# Patient Record
Sex: Female | Born: 1980 | Race: White | Hispanic: Yes | Smoking: Never smoker
Health system: Southern US, Community
[De-identification: ages and names within clinical notes are randomized; demographics above are authoritative.]

## PROBLEM LIST (undated history)

## (undated) ENCOUNTER — Inpatient Hospital Stay (HOSPITAL_COMMUNITY): Payer: Self-pay

## (undated) ENCOUNTER — Emergency Department (HOSPITAL_COMMUNITY): Admission: EM

## (undated) DIAGNOSIS — E119 Type 2 diabetes mellitus without complications: Secondary | ICD-10-CM

## (undated) DIAGNOSIS — N3021 Other chronic cystitis with hematuria: Secondary | ICD-10-CM

## (undated) DIAGNOSIS — N029 Recurrent and persistent hematuria with unspecified morphologic changes: Secondary | ICD-10-CM

## (undated) DIAGNOSIS — R011 Cardiac murmur, unspecified: Secondary | ICD-10-CM

## (undated) DIAGNOSIS — F419 Anxiety disorder, unspecified: Secondary | ICD-10-CM

## (undated) DIAGNOSIS — O24419 Gestational diabetes mellitus in pregnancy, unspecified control: Secondary | ICD-10-CM

## (undated) DIAGNOSIS — O139 Gestational [pregnancy-induced] hypertension without significant proteinuria, unspecified trimester: Secondary | ICD-10-CM

## (undated) HISTORY — DX: Gestational diabetes mellitus in pregnancy, unspecified control: O24.419

## (undated) HISTORY — PX: HAND SURGERY: SHX662

## (undated) HISTORY — PX: ABDOMINOPLASTY: SUR9

## (undated) HISTORY — DX: Anxiety disorder, unspecified: F41.9

---
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
## 2005-11-21 ENCOUNTER — Encounter: Admission: RE | Admit: 2005-11-21 | Discharge: 2005-11-21 | Payer: Self-pay | Admitting: Occupational Medicine

## 2005-12-23 ENCOUNTER — Encounter: Admission: RE | Admit: 2005-12-23 | Discharge: 2006-02-04 | Payer: Self-pay | Admitting: Occupational Medicine

## 2007-09-07 ENCOUNTER — Encounter: Admission: RE | Admit: 2007-09-07 | Discharge: 2007-10-27 | Payer: Self-pay | Admitting: Obstetrics & Gynecology

## 2007-09-23 ENCOUNTER — Inpatient Hospital Stay (HOSPITAL_COMMUNITY): Admission: AD | Admit: 2007-09-23 | Discharge: 2007-09-23 | Payer: Self-pay | Admitting: Obstetrics and Gynecology

## 2007-10-13 ENCOUNTER — Inpatient Hospital Stay (HOSPITAL_COMMUNITY): Admission: AD | Admit: 2007-10-13 | Discharge: 2007-10-13 | Payer: Self-pay | Admitting: Obstetrics and Gynecology

## 2007-11-11 ENCOUNTER — Inpatient Hospital Stay (HOSPITAL_COMMUNITY): Admission: RE | Admit: 2007-11-11 | Discharge: 2007-11-14 | Payer: Self-pay | Admitting: Obstetrics and Gynecology

## 2007-11-12 ENCOUNTER — Encounter (INDEPENDENT_AMBULATORY_CARE_PROVIDER_SITE_OTHER): Payer: Self-pay | Admitting: Obstetrics and Gynecology

## 2010-09-10 NOTE — Op Note (Signed)
[name], Summer Padilla             ACCOUNT NO.:  [id number]   MEDICAL RECORD NO.:  [id number]          PATIENT TYPE:  INP   LOCATION:  9147                          FACILITY:  [facility]   PHYSICIAN:  Carrington Clamp, M.D. DATE OF BIRTH:  Jan 26, 1981   DATE OF PROCEDURE:  11/12/2007  DATE OF DISCHARGE:                               OPERATIVE REPORT   PREOPERATIVE DIAGNOSES:  1. Arrest of active phase.  2. Nonreassuring fetal heart tones, remote from vaginal delivery.   POSTOPERATIVE DIAGNOSES:  1. Arrest of active phase.  2. Nonreassuring fetal heart tones, remote from vaginal delivery.   PROCEDURE:  Low-transverse cesarean section.   SURGEON:  Carrington Clamp, MD   ASSISTANTS:  None.   ANESTHESIA:  Epidural.   FINDINGS:  A female infant, Apgars 8 and 9, weight 8 pounds even, normal  tubes, ovaries and uterus seen.   SPECIMEN:  Placenta to pathology.   BLOOD LOSS:  600 mL.   IV FLUIDS:  2000 mL.   URINE OUTPUT:  200 mL.   COMPLICATIONS:  None.   MEDICATIONS:  Ancef and Pitocin.   COUNTS:  Correct x3.   TECHNIQUE:  After adequate epidural anesthesia was achieved, the patient  was prepped and draped in the usual sterile fashion in dorsal supine  position with leftward tilt.  A Pfannenstiel skin incision was made with  a scalpel and carried down to the fascia with Bovie cautery.  The fascia  was incised in the midline with the scalpel and carried in a transverse  curvilinear manner with the Mayo scissors.  The fascia was reflected  superior and inferior from the rectus muscles.  The rectus muscles were  split in the midline.  A bowel-free portion of peritoneum was entered  into carefully with blunt dissection, and then opened in a superior-  inferior manner with good visualization of bowel and the bladder.   The [employer] was then placed to operating field.  The  vesicouterine fascia was then tented up and incised with a pair of  Metzenbaum scissors, and the  bladder flap was created with the sharp and  blunt dissection.  A 2-cm incision was made in the upper portion of the  lower uterine segment until the clear fluid could be seen and on entry  into amnion.  The uterine incision was extended manually, and the baby  was identified in the OP presentation and delivered without  complication.  There was a nuchal cord x1, which was reduced, and the  baby was bulb suctioned, and cord was clamped and cut, and the baby was  handed to awaiting pediatrics.   The placenta was then delivered manually, and the uterus was cleared of  all debris with a wet lap.  The uterine incision was closed with a  running lock stitch of 0 Monocryl.  An imbricating layer of 0 Monocryl  was performed as well.  Once hemostasis was achieved with an additional  figure-of-eight stitch, the abdomen was irrigated, and the [employer] removed from the abdomen.  The peritoneum was then closed  with a running stitch of 2-0 Vicryl,  which incorporated the rectus  muscles as a separate layer.  The fascia was closed with running stitch  of 0 Vicryl.  The subcutaneous tissue was rendered hemostatic with Bovie  cautery and irrigation.  The subcutaneous tissue was closed with  interrupted stitches of 2-0 plain gut.  Staples were used to close the  skin.  The patient was returned to recovery room in stable condition.      Carrington Clamp, M.D.  Electronically Signed     MH/MEDQ  D:  11/12/2007  T:  11/12/2007  Job:  [id number]

## 2010-09-13 NOTE — Discharge Summary (Signed)
Summer Padilla, Summer Padilla             ACCOUNT NO.:  [id number]   MEDICAL RECORD NO.:  [id number]          PATIENT TYPE:  INP   LOCATION:  9147                          FACILITY:  [facility]   PHYSICIAN:  Malva Limes, M.D.    DATE OF BIRTH:  Jul 13, 1980   DATE OF ADMISSION:  11/11/2007  DATE OF DISCHARGE:  11/14/2007                               DISCHARGE SUMMARY   FINAL DIAGNOSES:  1. Intrauterine pregnancy at 88 weeks' gestation.  2. Induction of labor.  3. Gestational diabetes mellitus.  4. Arrest of the active phase of labor.  5. Nonreassuring fetal heart tones, remote from vaginal delivery.   PROCEDURE:  Primary low-transverse cesarean section.   SURGEON:  Carrington Clamp, MD   COMPLICATIONS:  None.   This 30 year old G3, P 1-0-1-1 presents at 60 weeks' gestation for an  induction at term.  The patient did have gestational diabetes and had  been on glyburide for her blood sugars.  The patient had also had  antepartum monitoring with nonstress tests and BPPs, which were normal.  Otherwise, the patient's antepartum course was uncomplicated.  She did  have a negative group B strep culture obtained at term.  The patient was  admitted for induction of labor.  She had a protracted labor curve  throughout the day.  There was continuation of some moderate variables  noted on the monitor because of this arrest of the active phase of labor  and these nonreassuring fetal heart tones, a decision was made to  proceed with a cesarean section.  The patient was taken to the operating  room on November 12, 2007, by Dr. Carrington Clamp, where a primary low  transverse cesarean section was performed with the delivery of an 8-  pound female infant with Apgars of 8 and 9.  The delivery went without  complications.  The patient's postoperative course was benign without  significant fevers.  The patient was felt ready for discharge.  On  postoperative day #2, she was sent home on a regular diet, told to  decrease activities, told to continue her prenatal vitamins and was  given Percocet 1-2 every 4-6 hours as needed for pain, and was to  continue her iron supplement daily, was to follow up in our office in 2  weeks for an incision check with Dr. Henderson Cloud.   LABORATORY ON DISCHARGE:  The patient had a hemoglobin of 8.5, white  blood cell count of 10.4, and platelets of 196,000.      Leilani Able, P.A.-C.      Malva Limes, M.D.     MB/MEDQ  D:  12/21/2007  T:  12/22/2007  Job:  [id number]

## 2011-01-22 LAB — URINALYSIS, ROUTINE W REFLEX MICROSCOPIC
Bilirubin Urine: NEGATIVE
Glucose, UA: NEGATIVE
Ketones, ur: NEGATIVE
Leukocytes, UA: NEGATIVE
Nitrite: NEGATIVE
Protein, ur: NEGATIVE
Specific Gravity, Urine: 1.025
Urobilinogen, UA: 0.2
pH: 6.5

## 2011-01-22 LAB — URINE MICROSCOPIC-ADD ON

## 2011-01-24 LAB — CBC
HCT: 26.3 — ABNORMAL LOW
HCT: 28.9 — ABNORMAL LOW
HCT: 35.9 — ABNORMAL LOW
Hemoglobin: 11.8 — ABNORMAL LOW
Hemoglobin: 8.5 — ABNORMAL LOW
Hemoglobin: 9.5 — ABNORMAL LOW
MCHC: 32.4
MCHC: 32.7
MCHC: 32.9
MCV: 82.6
MCV: 83.2
MCV: 83.9
Platelets: 192
Platelets: 196
Platelets: 222
RBC: 3.13 — ABNORMAL LOW
RBC: 3.48 — ABNORMAL LOW
RBC: 4.35
RDW: 14.6
RDW: 15.1
RDW: 15.2
WBC: 10.4
WBC: 13.2 — ABNORMAL HIGH
WBC: 8.1

## 2011-01-24 LAB — GLUCOSE, CAPILLARY
Glucose-Capillary: 110 — ABNORMAL HIGH
Glucose-Capillary: 132 — ABNORMAL HIGH
Glucose-Capillary: 61 — ABNORMAL LOW
Glucose-Capillary: 72
Glucose-Capillary: 77
Glucose-Capillary: 77
Glucose-Capillary: 79

## 2011-01-24 LAB — RPR: RPR Ser Ql: NONREACTIVE

## 2012-01-29 LAB — OB RESULTS CONSOLE RPR: RPR: NONREACTIVE

## 2012-01-29 LAB — CYSTIC FIBROSIS DIAGNOSTIC STUDY: Interpretation-CFDNA:: NEGATIVE

## 2012-01-29 LAB — OB RESULTS CONSOLE ABO/RH: RH Type: POSITIVE

## 2012-01-29 LAB — OB RESULTS CONSOLE HIV ANTIBODY (ROUTINE TESTING): HIV: NONREACTIVE

## 2012-01-29 LAB — OB RESULTS CONSOLE RUBELLA ANTIBODY, IGM: Rubella: IMMUNE

## 2012-01-29 LAB — OB RESULTS CONSOLE VARICELLA ZOSTER ANTIBODY, IGG: Varicella: IMMUNE

## 2012-01-29 LAB — OB RESULTS CONSOLE HEPATITIS B SURFACE ANTIGEN: Hepatitis B Surface Ag: NEGATIVE

## 2012-01-29 LAB — OB RESULTS CONSOLE ANTIBODY SCREEN: Antibody Screen: NEGATIVE

## 2012-01-29 LAB — SICKLE CELL SCREEN: Sickle Cell Screen: NEGATIVE

## 2012-02-28 ENCOUNTER — Inpatient Hospital Stay (HOSPITAL_COMMUNITY): Payer: Medicaid Other

## 2012-02-28 ENCOUNTER — Inpatient Hospital Stay (HOSPITAL_COMMUNITY)
Admission: AD | Admit: 2012-02-28 | Discharge: 2012-02-28 | Disposition: A | Discharge status: Home / Self Care | Payer: Medicaid Other | Source: Ambulatory Visit | Attending: Family Medicine | Admitting: Family Medicine

## 2012-02-28 ENCOUNTER — Encounter (HOSPITAL_COMMUNITY): Payer: Self-pay | Admitting: *Deleted

## 2012-02-28 DIAGNOSIS — O209 Hemorrhage in early pregnancy, unspecified: Secondary | ICD-10-CM | POA: Insufficient documentation

## 2012-02-28 DIAGNOSIS — IMO0002 Reserved for concepts with insufficient information to code with codable children: Secondary | ICD-10-CM

## 2012-02-28 DIAGNOSIS — O468X9 Other antepartum hemorrhage, unspecified trimester: Secondary | ICD-10-CM

## 2012-02-28 DIAGNOSIS — O418X90 Other specified disorders of amniotic fluid and membranes, unspecified trimester, not applicable or unspecified: Secondary | ICD-10-CM

## 2012-02-28 HISTORY — DX: Cardiac murmur, unspecified: R01.1

## 2012-02-28 LAB — URINALYSIS, ROUTINE W REFLEX MICROSCOPIC
Bilirubin Urine: NEGATIVE
Glucose, UA: NEGATIVE mg/dL
Ketones, ur: 15 mg/dL — AB
Nitrite: NEGATIVE
Protein, ur: 100 mg/dL — AB
Specific Gravity, Urine: 1.02 (ref 1.005–1.030)
Urobilinogen, UA: 1 mg/dL (ref 0.0–1.0)
pH: 7.5 (ref 5.0–8.0)

## 2012-02-28 LAB — CBC
HCT: 37.2 % (ref 36.0–46.0)
Hemoglobin: 12.3 g/dL (ref 12.0–15.0)
MCH: 26.7 pg (ref 26.0–34.0)
MCHC: 33.1 g/dL (ref 30.0–36.0)
MCV: 80.9 fL (ref 78.0–100.0)
Platelets: 198 10*3/uL (ref 150–400)
RBC: 4.6 MIL/uL (ref 3.87–5.11)
RDW: 13.4 % (ref 11.5–15.5)
WBC: 7.3 10*3/uL (ref 4.0–10.5)

## 2012-02-28 LAB — WET PREP, GENITAL
Clue Cells Wet Prep HPF POC: NONE SEEN
Trich, Wet Prep: NONE SEEN
Yeast Wet Prep HPF POC: NONE SEEN

## 2012-02-28 LAB — URINE MICROSCOPIC-ADD ON

## 2012-02-28 LAB — ABO/RH: ABO/RH(D): O POS

## 2012-02-28 NOTE — MAU Provider Note (Signed)
Chart reviewed and agree with management and plan.  

## 2012-02-28 NOTE — MAU Note (Signed)
Pt presents to MAU with chief complaint of vaginal bleeding. Pt says the bleeding started 45 mins ago; she noticed several small blood clots. Pt is [redacted]w[redacted]d. Pt denies any new onset of pain, says she has back pain but that started a while back

## 2012-02-28 NOTE — MAU Provider Note (Signed)
History     CSN: [id number]  Arrival date and time: 02/28/12 4098   First Provider Initiated Contact with Patient 02/28/12 1633      Chief Complaint  Patient presents with  . Vaginal Bleeding   HPI 31 y.o. [id number] at [redacted]w[redacted]d with vaginal bleeding x about 1 hour, low back pain ongoing for a few weeks. No prior bleeding episodes. PNC at [facility], normal ultrasound there. No pelvic exam yet.    Past Medical History  Diagnosis Date  . Murmur, cardiac     Past Surgical History  Procedure Date  . Cesarean section   . Hand surgery     History reviewed. No pertinent family history.  History  Substance Use Topics  . Smoking status: Never Smoker   . Smokeless tobacco: Not on file  . Alcohol Use: No    Allergies: No Known Allergies  Prescriptions prior to admission  Medication Sig Dispense Refill  . Prenatal Vit-Fe Fumarate-FA (PRENATAL MULTIVITAMIN) TABS Take 1 tablet by mouth daily.        Review of Systems  Constitutional: Negative.   Respiratory: Negative.   Cardiovascular: Negative.   Gastrointestinal: Negative for nausea, vomiting, abdominal pain, diarrhea and constipation.  Genitourinary: Negative for dysuria, urgency, frequency, hematuria and flank pain.       Positive for vaginal bleeding   Musculoskeletal: Negative.   Neurological: Negative.   Psychiatric/Behavioral: Negative.    Physical Exam   Blood pressure 109/46, pulse 90, temperature 99.1 F (37.3 C), resp. rate 18, height 5\' 2"  (1.575 m), weight 97.614 kg (215 lb 3.2 oz), last menstrual period 11/18/2011.  Physical Exam  Nursing note and vitals reviewed. Constitutional: She is oriented to person, place, and time. She appears well-developed and well-nourished. No distress.  Cardiovascular: Normal rate.   Respiratory: Effort normal.  GI: Soft. There is no tenderness.  Genitourinary: There is no tenderness or lesion on the right labia. There is no tenderness or lesion on the left labia. Uterus  is not tender. Cervix exhibits no motion tenderness and no discharge. Right adnexum displays no tenderness and no fullness. Left adnexum displays no tenderness and no fullness. There is bleeding (small clots in vagina, no active bleeding) around the vagina.       Cervix long and closed   Musculoskeletal: Normal range of motion.  Neurological: She is oriented to person, place, and time.  Skin: Skin is warm.  Psychiatric: She has a normal mood and affect.    MAU Course  Procedures  Results for orders placed during the hospital encounter of 02/28/12 (from the past 24 hour(s))  URINALYSIS, ROUTINE W REFLEX MICROSCOPIC     Status: Abnormal   Collection Time   02/28/12  4:05 PM      Component Value Range   Color, Urine RED (*) YELLOW   APPearance CLOUDY (*) CLEAR   Specific Gravity, Urine 1.020  1.005 - 1.030   pH 7.5  5.0 - 8.0   Glucose, UA NEGATIVE  NEGATIVE mg/dL   Hgb urine dipstick LARGE (*) NEGATIVE   Bilirubin Urine NEGATIVE  NEGATIVE   Ketones, ur 15 (*) NEGATIVE mg/dL   Protein, ur 295 (*) NEGATIVE mg/dL   Urobilinogen, UA 1.0  0.0 - 1.0 mg/dL   Nitrite NEGATIVE  NEGATIVE   Leukocytes, UA TRACE (*) NEGATIVE  URINE MICROSCOPIC-ADD ON     Status: Abnormal   Collection Time   02/28/12  4:05 PM      Component Value Range   Squamous  Epithelial / LPF FEW (*) RARE   WBC, UA 3-6  <3 WBC/hpf   RBC / HPF TOO NUMEROUS TO COUNT  <3 RBC/hpf   Bacteria, UA FEW (*) RARE  CBC     Status: Normal   Collection Time   02/28/12  4:20 PM      Component Value Range   WBC 7.3  4.0 - 10.5 K/uL   RBC 4.60  3.87 - 5.11 MIL/uL   Hemoglobin 12.3  12.0 - 15.0 g/dL   HCT 16.1  09.6 - 04.5 %   MCV 80.9  78.0 - 100.0 fL   MCH 26.7  26.0 - 34.0 pg   MCHC 33.1  30.0 - 36.0 g/dL   RDW 40.9  81.1 - 91.4 %   Platelets 198  150 - 400 K/uL  ABO/RH     Status: Normal   Collection Time   02/28/12  4:20 PM      Component Value Range   ABO/RH(D) O POS    WET PREP, GENITAL     Status: Abnormal    Collection Time   02/28/12  4:39 PM      Component Value Range   Yeast Wet Prep HPF POC NONE SEEN  NONE SEEN   Trich, Wet Prep NONE SEEN  NONE SEEN   Clue Cells Wet Prep HPF POC NONE SEEN  NONE SEEN   WBC, Wet Prep HPF POC FEW (*) NONE SEEN   US Ob Comp Less 14 Wks  02/28/2012  *RADIOLOGY REPORT*  Clinical Data: Vaginal bleeding  OBSTETRIC <14 WK ULTRASOUND, TRANSVAGINAL OB US  Technique:  Transabdominal and transvaginal ultrasound was performed for evaluation of the gestation as well as the maternal uterus and adnexal regions.  Findings:  There is a single intrauterine gestational sac which contains an embryo.  Fetal heart rate is 176 bpm.  Crown-rump length equals 4.81 cm.  This corresponds to a 11-week-5- day gestation.  EDC:  09/17/2012  Maternal uterus/adnexae:  There is a small subchorionic hemorrhage which measures 1.4 x 1.3 x 1.2 cm.  The right ovary is not visualized.  The left ovary is normal.  No free fluid.  IMPRESSION:  1.  Single living intrauterine gestation. 2.  Estimated gestational age is 11 weeks and 5 days.  This is concordant with the original assigned gestational age [redacted] weeks and 1 day. 3.  Small subchorionic hemorrhage.   Original Report Authenticated By: Signa Kell, M.D.     Assessment and Plan   1. Subchorionic hematoma   2. Bleeding in early pregnancy       Medication List     As of 02/28/2012  7:01 PM    CONTINUE taking these medications         prenatal multivitamin Tabs            Follow-up Information    Follow up with [facility]. (as scheduled or sooner as needed)    Contact information:   [address] [place] [address] [phone]           Jafari Mckillop 02/28/2012, 5:45 PM

## 2012-02-28 NOTE — MAU Note (Signed)
Pt reports she started having vaginal bleeding 1 hr ago "alot of clots". Having some cramping as well.

## 2012-02-29 LAB — URINE CULTURE: Colony Count: 10000

## 2012-03-01 LAB — GC/CHLAMYDIA PROBE AMP, GENITAL
Chlamydia, DNA Probe: NEGATIVE
GC Probe Amp, Genital: NEGATIVE

## 2012-04-09 ENCOUNTER — Emergency Department (HOSPITAL_BASED_OUTPATIENT_CLINIC_OR_DEPARTMENT_OTHER)
Admission: EM | Admit: 2012-04-09 | Discharge: 2012-04-09 | Disposition: A | Discharge status: Home / Self Care | Payer: Medicaid Other | Attending: Emergency Medicine | Admitting: Emergency Medicine

## 2012-04-09 ENCOUNTER — Encounter (HOSPITAL_BASED_OUTPATIENT_CLINIC_OR_DEPARTMENT_OTHER): Payer: Self-pay | Admitting: *Deleted

## 2012-04-09 DIAGNOSIS — R011 Cardiac murmur, unspecified: Secondary | ICD-10-CM | POA: Insufficient documentation

## 2012-04-09 DIAGNOSIS — O9989 Other specified diseases and conditions complicating pregnancy, childbirth and the puerperium: Secondary | ICD-10-CM | POA: Insufficient documentation

## 2012-04-09 DIAGNOSIS — R55 Syncope and collapse: Secondary | ICD-10-CM

## 2012-04-09 DIAGNOSIS — Z8742 Personal history of other diseases of the female genital tract: Secondary | ICD-10-CM | POA: Insufficient documentation

## 2012-04-09 DIAGNOSIS — Z3490 Encounter for supervision of normal pregnancy, unspecified, unspecified trimester: Secondary | ICD-10-CM

## 2012-04-09 HISTORY — DX: Gestational (pregnancy-induced) hypertension without significant proteinuria, unspecified trimester: O13.9

## 2012-04-09 HISTORY — DX: Recurrent and persistent hematuria with unspecified morphologic changes: N02.9

## 2012-04-09 LAB — CBC WITH DIFFERENTIAL/PLATELET
Basophils Absolute: 0 10*3/uL (ref 0.0–0.1)
Basophils Relative: 0 % (ref 0–1)
Eosinophils Absolute: 0.1 10*3/uL (ref 0.0–0.7)
Eosinophils Relative: 1 % (ref 0–5)
HCT: 33.6 % — ABNORMAL LOW (ref 36.0–46.0)
Hemoglobin: 11.3 g/dL — ABNORMAL LOW (ref 12.0–15.0)
Lymphocytes Relative: 21 % (ref 12–46)
Lymphs Abs: 1.5 10*3/uL (ref 0.7–4.0)
MCH: 26.8 pg (ref 26.0–34.0)
MCHC: 33.6 g/dL (ref 30.0–36.0)
MCV: 79.6 fL (ref 78.0–100.0)
Monocytes Absolute: 0.4 10*3/uL (ref 0.1–1.0)
Monocytes Relative: 6 % (ref 3–12)
Neutro Abs: 5.2 10*3/uL (ref 1.7–7.7)
Neutrophils Relative %: 73 % (ref 43–77)
Platelets: 187 10*3/uL (ref 150–400)
RBC: 4.22 MIL/uL (ref 3.87–5.11)
RDW: 13.6 % (ref 11.5–15.5)
WBC: 7.2 10*3/uL (ref 4.0–10.5)

## 2012-04-09 LAB — COMPREHENSIVE METABOLIC PANEL
ALT: 7 U/L (ref 0–35)
AST: 13 U/L (ref 0–37)
Albumin: 3 g/dL — ABNORMAL LOW (ref 3.5–5.2)
Alkaline Phosphatase: 52 U/L (ref 39–117)
BUN: 8 mg/dL (ref 6–23)
CO2: 22 mEq/L (ref 19–32)
Calcium: 9.8 mg/dL (ref 8.4–10.5)
Chloride: 102 mEq/L (ref 96–112)
Creatinine, Ser: 0.6 mg/dL (ref 0.50–1.10)
GFR calc Af Amer: >90 mL/min (ref >90)
GFR calc non Af Amer: >90 mL/min (ref >90)
Glucose, Bld: 126 mg/dL — ABNORMAL HIGH (ref 70–99)
Potassium: 4.1 mEq/L (ref 3.5–5.1)
Sodium: 135 mEq/L (ref 135–145)
Total Bilirubin: 0.4 mg/dL (ref 0.3–1.2)
Total Protein: 7.1 g/dL (ref 6.0–8.3)

## 2012-04-09 LAB — URINALYSIS, ROUTINE W REFLEX MICROSCOPIC
Bilirubin Urine: NEGATIVE
Bilirubin Urine: NEGATIVE
Glucose, UA: NEGATIVE mg/dL
Glucose, UA: NEGATIVE mg/dL
Ketones, ur: NEGATIVE mg/dL
Ketones, ur: NEGATIVE mg/dL
Leukocytes, UA: NEGATIVE
Nitrite: NEGATIVE
Nitrite: NEGATIVE
Protein, ur: 30 mg/dL — AB
Protein, ur: NEGATIVE mg/dL
Specific Gravity, Urine: 1.021 (ref 1.005–1.030)
Specific Gravity, Urine: 1.007 (ref 1.005–1.030)
Urobilinogen, UA: 1 mg/dL (ref 0.0–1.0)
Urobilinogen, UA: 0.2 mg/dL (ref 0.0–1.0)
pH: 7.5 (ref 5.0–8.0)
pH: 7.5 (ref 5.0–8.0)

## 2012-04-09 LAB — URINE MICROSCOPIC-ADD ON

## 2012-04-09 MED ORDER — METOCLOPRAMIDE HCL 5 MG/ML IJ SOLN
10.0000 mg | Freq: Once | INTRAMUSCULAR | Status: AC
  Administered 2012-04-09: 10 mg via INTRAVENOUS
  Filled 2012-04-09: qty 2

## 2012-04-09 MED ORDER — ACETAMINOPHEN 325 MG PO TABS
650.0000 mg | ORAL_TABLET | Freq: Once | ORAL | Status: AC
  Administered 2012-04-09: 650 mg via ORAL
  Filled 2012-04-09: qty 2

## 2012-04-09 MED ORDER — DIPHENHYDRAMINE HCL 50 MG/ML IJ SOLN
25.0000 mg | Freq: Once | INTRAMUSCULAR | Status: AC
  Administered 2012-04-09: 25 mg via INTRAVENOUS
  Filled 2012-04-09: qty 1

## 2012-04-09 MED ORDER — SODIUM CHLORIDE 0.9 % IV SOLN
Freq: Once | INTRAVENOUS | Status: AC
  Administered 2012-04-09: 16:00:00 via INTRAVENOUS

## 2012-04-09 MED ORDER — FENTANYL CITRATE 0.05 MG/ML IJ SOLN
INTRAMUSCULAR | Status: AC
  Filled 2012-04-09: qty 2

## 2012-04-09 MED ORDER — ONDANSETRON HCL 4 MG/2ML IJ SOLN
INTRAMUSCULAR | Status: AC
  Filled 2012-04-09: qty 2

## 2012-04-09 NOTE — ED Provider Notes (Signed)
Medical screening examination/treatment/procedure(s) were performed by non-physician practitioner and as supervising physician I was immediately available for consultation/collaboration.   Gwyneth Sprout, MD 04/09/12 [phone]

## 2012-04-09 NOTE — ED Notes (Signed)
Patient states she was standing at the dentist office with her daughter and had a sudden onset of dizziness, coldness in her face and felt like she was going to faint and fall.  Lasted about 6 minutes, and now c/o of a headache.  Dizziness has resolved.  States she has a history of headaches, and was seen by Neurologist and prescribed topamax, which she did not start because she became pregnant.  Pt is [redacted] weeks pregnant and is receiving regular prenatal care.  Denies any recent illness.

## 2012-04-09 NOTE — ED Notes (Signed)
No rx given. Pt's husband Elita Quick is driving her home

## 2012-04-09 NOTE — ED Provider Notes (Signed)
History     CSN: [id number]  Arrival date & time 04/09/12  1158   First MD Initiated Contact with Patient 04/09/12 1232      Chief Complaint  Patient presents with  . Dizziness    (Consider location/radiation/quality/duration/timing/severity/associated sxs/prior treatment) Patient is a 31 y.o. female presenting with headaches. The history is provided by the patient. No language interpreter was used.  Headache  This is a new problem. The current episode started less than 1 hour ago. The headache is associated with nothing. The pain is mild. The pain does not radiate. She has tried nothing for the symptoms. The treatment provided no relief.  Pt reports she was at the dentist office with her child.  Pt reports she felt like she was going to pass out.  (Pt was standing in room during a procedure)   Pt reports she currently has a headache  Past Medical History  Diagnosis Date  . Murmur, cardiac   . Gestational HTN     Sept 2012 post partum  . Benign hematuria     Past Surgical History  Procedure Date  . Cesarean section   . Hand surgery     No family history on file.  History  Substance Use Topics  . Smoking status: Never Smoker   . Smokeless tobacco: Not on file  . Alcohol Use: No    OB History    Grav Para Term Preterm Abortions TAB SAB Ect Mult Living   5 3 3  0 1 0 1 0 0 3      Review of Systems  Neurological: Positive for headaches.  All other systems reviewed and are negative.    Allergies  Review of patient's allergies indicates no known allergies.  Home Medications   Current Outpatient Rx  Name  Route  Sig  Dispense  Refill  . PRENATAL MULTIVITAMIN CH   Oral   Take 1 tablet by mouth daily.           BP 131/70  Pulse 86  Temp 98.4 F (36.9 C) (Oral)  Resp 20  Ht 5\' 2"  (1.575 m)  Wt 218 lb (98.884 kg)  BMI 39.87 kg/m2  SpO2 100%  LMP 11/18/2011  Physical Exam  Nursing note and vitals reviewed. Constitutional: She appears  well-developed and well-nourished.  HENT:  Head: Normocephalic.  Right Ear: External ear normal.  Left Ear: External ear normal.  Nose: Nose normal.  Mouth/Throat: Oropharynx is clear and moist.  Eyes: Conjunctivae normal and EOM are normal. Pupils are equal, round, and reactive to light.  Neck: Normal range of motion. Neck supple.  Cardiovascular: Normal rate.   Pulmonary/Chest: Effort normal.  Abdominal: Soft.  Musculoskeletal: Normal range of motion.  Neurological: She is alert.  Skin: Skin is warm.  Psychiatric: She has a normal mood and affect.    ED Course  Procedures (including critical care time)  Labs Reviewed  URINALYSIS, ROUTINE W REFLEX MICROSCOPIC - Abnormal; Notable for the following:    APPearance TURBID (*)     Hgb urine dipstick LARGE (*)     Protein, ur 30 (*)     Leukocytes, UA SMALL (*)     All other components within normal limits  CBC WITH DIFFERENTIAL - Abnormal; Notable for the following:    Hemoglobin 11.3 (*)     HCT 33.6 (*)     All other components within normal limits  COMPREHENSIVE METABOLIC PANEL - Abnormal; Notable for the following:    Glucose, Bld 126 (*)  Albumin 3.0 (*)     All other components within normal limits  URINE MICROSCOPIC-ADD ON - Abnormal; Notable for the following:    Squamous Epithelial / LPF MANY (*)     Bacteria, UA MANY (*)     All other components within normal limits  URINE CULTURE   No results found.   No diagnosis found.    MDM   Results for orders placed during the hospital encounter of 04/09/12  URINALYSIS, ROUTINE W REFLEX MICROSCOPIC      Component Value Range   Color, Urine YELLOW  YELLOW   APPearance TURBID (*) CLEAR   Specific Gravity, Urine 1.021  1.005 - 1.030   pH 7.5  5.0 - 8.0   Glucose, UA NEGATIVE  NEGATIVE mg/dL   Hgb urine dipstick LARGE (*) NEGATIVE   Bilirubin Urine NEGATIVE  NEGATIVE   Ketones, ur NEGATIVE  NEGATIVE mg/dL   Protein, ur 30 (*) NEGATIVE mg/dL   Urobilinogen, UA  1.0  0.0 - 1.0 mg/dL   Nitrite NEGATIVE  NEGATIVE   Leukocytes, UA SMALL (*) NEGATIVE  CBC WITH DIFFERENTIAL      Component Value Range   WBC 7.2  4.0 - 10.5 K/uL   RBC 4.22  3.87 - 5.11 MIL/uL   Hemoglobin 11.3 (*) 12.0 - 15.0 g/dL   HCT 40.9 (*) 81.1 - 91.4 %   MCV 79.6  78.0 - 100.0 fL   MCH 26.8  26.0 - 34.0 pg   MCHC 33.6  30.0 - 36.0 g/dL   RDW 78.2  95.6 - 21.3 %   Platelets 187  150 - 400 K/uL   Neutrophils Relative 73  43 - 77 %   Neutro Abs 5.2  1.7 - 7.7 K/uL   Lymphocytes Relative 21  12 - 46 %   Lymphs Abs 1.5  0.7 - 4.0 K/uL   Monocytes Relative 6  3 - 12 %   Monocytes Absolute 0.4  0.1 - 1.0 K/uL   Eosinophils Relative 1  0 - 5 %   Eosinophils Absolute 0.1  0.0 - 0.7 K/uL   Basophils Relative 0  0 - 1 %   Basophils Absolute 0.0  0.0 - 0.1 K/uL  COMPREHENSIVE METABOLIC PANEL      Component Value Range   Sodium 135  135 - 145 mEq/L   Potassium 4.1  3.5 - 5.1 mEq/L   Chloride 102  96 - 112 mEq/L   CO2 22  19 - 32 mEq/L   Glucose, Bld 126 (*) 70 - 99 mg/dL   BUN 8  6 - 23 mg/dL   Creatinine, Ser 0.86  0.50 - 1.10 mg/dL   Calcium 9.8  8.4 - 57.8 mg/dL   Total Protein 7.1  6.0 - 8.3 g/dL   Albumin 3.0 (*) 3.5 - 5.2 g/dL   AST 13  0 - 37 U/L   ALT 7  0 - 35 U/L   Alkaline Phosphatase 52  39 - 117 U/L   Total Bilirubin 0.4  0.3 - 1.2 mg/dL   GFR calc non Af Amer >90  >90 mL/min   GFR calc Af Amer >90  >90 mL/min  URINE MICROSCOPIC-ADD ON      Component Value Range   Squamous Epithelial / LPF MANY (*) RARE   WBC, UA 3-6  <3 WBC/hpf   RBC / HPF 3-6  <3 RBC/hpf   Bacteria, UA MANY (*) RARE   Urine-Other AMORPHOUS URATES/PHOSPHATES    URINALYSIS, ROUTINE W  REFLEX MICROSCOPIC      Component Value Range   Color, Urine YELLOW  YELLOW   APPearance CLEAR  CLEAR   Specific Gravity, Urine 1.007  1.005 - 1.030   pH 7.5  5.0 - 8.0   Glucose, UA NEGATIVE  NEGATIVE mg/dL   Hgb urine dipstick MODERATE (*) NEGATIVE   Bilirubin Urine NEGATIVE  NEGATIVE   Ketones, ur  NEGATIVE  NEGATIVE mg/dL   Protein, ur NEGATIVE  NEGATIVE mg/dL   Urobilinogen, UA 0.2  0.0 - 1.0 mg/dL   Nitrite NEGATIVE  NEGATIVE   Leukocytes, UA NEGATIVE  NEGATIVE  URINE MICROSCOPIC-ADD ON      Component Value Range   Squamous Epithelial / LPF FEW (*) RARE   WBC, UA 0-2  <3 WBC/hpf   RBC / HPF 3-6  <3 RBC/hpf   Bacteria, UA RARE  RARE   Urine-Other MUCOUS PRESENT     No results found.   No relief with tylenol.   Pt advised to follow up as scheduled with her GYn.          Lonia Skinner [place], [place] 04/09/12 [phone]

## 2012-04-10 LAB — URINE CULTURE: Colony Count: 10000

## 2012-04-28 NOTE — L&D Delivery Note (Signed)
Delivery Note Pt pushed well and at 11:25 PM a viable female was delivered via Vaginal, Spontaneous Delivery (Presentation: ; Occiput Anterior).  APGAR: 9, 9; weight: pending.  Infant dried and placed on pt's abd. Cord clamped and cut by FOB. Hospital cord sample collected. Placenta status: Intact, Spontaneous.  Cord: 3 vessels. After placenta delivered the uterus was sl boggy. Given of cytotec PR as well as Pitocin bolus resulting in firm uterus and minimal bldg.  Anesthesia: Epidural  Episiotomy: None Lacerations: None Est. Blood Loss (mL): 350  Mom to AICU for 24hr of PP magnesium sulfate.  Baby to nursery-stable.  Cam Hai 08/13/2012, 11:53 PM

## 2012-06-16 ENCOUNTER — Encounter (HOSPITAL_COMMUNITY): Payer: Self-pay | Admitting: *Deleted

## 2012-06-16 ENCOUNTER — Inpatient Hospital Stay (HOSPITAL_COMMUNITY)
Admission: AD | Admit: 2012-06-16 | Discharge: 2012-06-17 | Disposition: A | Discharge status: Home / Self Care | Payer: Medicaid Other | Source: Ambulatory Visit | Attending: Obstetrics & Gynecology | Admitting: Obstetrics & Gynecology

## 2012-06-16 DIAGNOSIS — O26899 Other specified pregnancy related conditions, unspecified trimester: Secondary | ICD-10-CM

## 2012-06-16 DIAGNOSIS — O368190 Decreased fetal movements, unspecified trimester, not applicable or unspecified: Secondary | ICD-10-CM | POA: Insufficient documentation

## 2012-06-16 DIAGNOSIS — R519 Headache, unspecified: Secondary | ICD-10-CM

## 2012-06-16 DIAGNOSIS — O212 Late vomiting of pregnancy: Secondary | ICD-10-CM | POA: Insufficient documentation

## 2012-06-16 DIAGNOSIS — R51 Headache: Secondary | ICD-10-CM | POA: Insufficient documentation

## 2012-06-16 DIAGNOSIS — Z3689 Encounter for other specified antenatal screening: Secondary | ICD-10-CM

## 2012-06-16 DIAGNOSIS — O219 Vomiting of pregnancy, unspecified: Secondary | ICD-10-CM

## 2012-06-16 DIAGNOSIS — O26879 Cervical shortening, unspecified trimester: Secondary | ICD-10-CM | POA: Insufficient documentation

## 2012-06-16 LAB — URINALYSIS, ROUTINE W REFLEX MICROSCOPIC
Bilirubin Urine: NEGATIVE
Glucose, UA: NEGATIVE mg/dL
Ketones, ur: NEGATIVE mg/dL
Leukocytes, UA: NEGATIVE
Nitrite: NEGATIVE
Protein, ur: NEGATIVE mg/dL
Specific Gravity, Urine: >1.03 — ABNORMAL HIGH (ref 1.005–1.030)
Urobilinogen, UA: 0.2 mg/dL (ref 0.0–1.0)
pH: 6 (ref 5.0–8.0)

## 2012-06-16 LAB — CBC
HCT: 34.8 % — ABNORMAL LOW (ref 36.0–46.0)
Hemoglobin: 11.2 g/dL — ABNORMAL LOW (ref 12.0–15.0)
MCH: 26.6 pg (ref 26.0–34.0)
MCHC: 32.2 g/dL (ref 30.0–36.0)
MCV: 82.7 fL (ref 78.0–100.0)
Platelets: 198 10*3/uL (ref 150–400)
RBC: 4.21 MIL/uL (ref 3.87–5.11)
RDW: 14.1 % (ref 11.5–15.5)
WBC: 10.8 10*3/uL — ABNORMAL HIGH (ref 4.0–10.5)

## 2012-06-16 LAB — URINE MICROSCOPIC-ADD ON

## 2012-06-16 MED ORDER — ONDANSETRON 8 MG PO TBDP
8.0000 mg | ORAL_TABLET | ORAL | Status: AC
  Administered 2012-06-17: 8 mg via ORAL
  Filled 2012-06-16: qty 1

## 2012-06-16 NOTE — MAU Provider Note (Signed)
Chief Complaint:  Decreased Fetal Movement C/O headache, nausea, and bilateral rib pain for past 1 week, thinks it is related to vaginal progesterone.   First Provider Initiated Contact with Patient 06/16/12 2317      HPI: Summer Padilla is a 32 y.o. [id number] at 72w5dwho presents to maternity admissions reporting headache, nausea and bilateral rib pain for past week. She thinks this is related to her starting vaginal progesterone 2 weeks ago.  Progesterone started r/t cervical length of 2.3cm. Has history of headaches that are usually relieved by Tylenol, but Tylenol has provided no relief this time. Nauseated, but has not vomited and does have decreased appetite. Is not taking anything for nausea. Denies contractions/cramping, leakage of fluid or vaginal bleeding. Decreased fetal movement on occasion, but is feeling baby move today.  Pregnancy Course:  Shortened cervix found on anatomy scan, most recent 2.4cm two weeks ago. Pt has received BMZ x2 and started on vaginal progesterone.   Past Medical History: Past Medical History  Diagnosis Date  . Murmur, cardiac   . Gestational HTN     Sept 2012 post partum  . Benign hematuria     Past obstetric history: OB History   Grav Para Term Preterm Abortions TAB SAB Ect Mult Living   5 3 3  0 1 0 1 0 0 3     # Outc Date GA Lbr Len/2nd Wgt Sex Del Anes PTL Lv   1 SAB            2 TRM      SVD      3 TRM      LTCS      4 TRM      SVD      5 CUR               Past Surgical History: Past Surgical History  Procedure Laterality Date  . Cesarean section    . Hand surgery      Family History: History reviewed. No pertinent family history.  Social History: History  Substance Use Topics  . Smoking status: Never Smoker   . Smokeless tobacco: Not on file  . Alcohol Use: No    Allergies: No Known Allergies  Meds:  Prescriptions prior to admission  Medication Sig Dispense Refill  . acetaminophen (TYLENOL) 325 MG tablet Take 650 mg  by mouth every 6 (six) hours as needed for pain.      . progesterone 200 MG SUPP Place 200 mg vaginally at bedtime.      . Prenatal Vit-Fe Fumarate-FA (PRENATAL MULTIVITAMIN) TABS Take 1 tablet by mouth daily.        ROS: Pertinent findings in history of present illness.  Physical Exam  Blood pressure 133/81, pulse 94, temperature 97.9 F (36.6 C), temperature source Oral, resp. rate 18, height 5\' 3"  (1.6 m), weight 102.967 kg (227 lb), last menstrual period 11/18/2011. GENERAL: Well-developed, well-nourished female in no acute distress.  HEENT: normocephalic HEART: normal rate RESP: normal effort ABDOMEN: Soft, non-tender, gravid appropriate for gestational age EXTREMITIES: Nontender, no edema NEURO: alert and oriented     FHT:  Baseline 140 , moderate variability, accelerations present, no decelerations Contractions: none on toco or by palpation   Labs:  Results for orders placed during the hospital encounter of 06/16/12 (from the past 24 hour(s))  URINALYSIS, ROUTINE W REFLEX MICROSCOPIC     Status: Abnormal   Collection Time    06/16/12  9:50 PM      Result  Value Range   Color, Urine YELLOW  YELLOW   APPearance CLEAR  CLEAR   Specific Gravity, Urine >1.030 (*) 1.005 - 1.030   pH 6.0  5.0 - 8.0   Glucose, UA NEGATIVE  NEGATIVE mg/dL   Hgb urine dipstick LARGE (*) NEGATIVE   Bilirubin Urine NEGATIVE  NEGATIVE   Ketones, ur NEGATIVE  NEGATIVE mg/dL   Protein, ur NEGATIVE  NEGATIVE mg/dL   Urobilinogen, UA 0.2  0.0 - 1.0 mg/dL   Nitrite NEGATIVE  NEGATIVE   Leukocytes, UA NEGATIVE  NEGATIVE  URINE MICROSCOPIC-ADD ON     Status: Abnormal   Collection Time    06/16/12  9:50 PM      Result Value Range   Squamous Epithelial / LPF FEW (*) RARE   WBC, UA 3-6  <3 WBC/hpf   RBC / HPF 7-10  <3 RBC/hpf   Bacteria, UA MANY (*) RARE  PROTEIN / CREATININE RATIO, URINE     Status: Abnormal   Collection Time    06/16/12  9:50 PM      Result Value Range   Creatinine, Urine  124.91     Total Protein, Urine 34.1     PROTEIN CREATININE RATIO 0.27 (*) 0.00 - 0.15  CBC     Status: Abnormal   Collection Time    06/16/12 11:35 PM      Result Value Range   WBC 10.8 (*) 4.0 - 10.5 K/uL   RBC 4.21  3.87 - 5.11 MIL/uL   Hemoglobin 11.2 (*) 12.0 - 15.0 g/dL   HCT 11.9 (*) 14.7 - 82.9 %   MCV 82.7  78.0 - 100.0 fL   MCH 26.6  26.0 - 34.0 pg   MCHC 32.2  30.0 - 36.0 g/dL   RDW 56.2  13.0 - 86.5 %   Platelets 198  150 - 400 K/uL  COMPREHENSIVE METABOLIC PANEL     Status: Abnormal   Collection Time    06/16/12 11:35 PM      Result Value Range   Sodium 138  135 - 145 mEq/L   Potassium 3.6  3.5 - 5.1 mEq/L   Chloride 106  96 - 112 mEq/L   CO2 22  19 - 32 mEq/L   Glucose, Bld 107 (*) 70 - 99 mg/dL   BUN 7  6 - 23 mg/dL   Creatinine, Ser 7.84 (*) 0.50 - 1.10 mg/dL   Calcium 9.1  8.4 - 69.6 mg/dL   Total Protein 6.2  6.0 - 8.3 g/dL   Albumin 2.6 (*) 3.5 - 5.2 g/dL   AST 12  0 - 37 U/L   ALT 7  0 - 35 U/L   Alkaline Phosphatase 77  39 - 117 U/L   Total Bilirubin 0.3  0.3 - 1.2 mg/dL   GFR calc non Af Amer >90  >90 mL/min   GFR calc Af Amer >90  >90 mL/min    ED Course BP borderline in 130s/80s. CBC and CMP sent, results WNL Zofran ODT 8mg  and Fioricet given, both with good results.   Assessment: 1. Decreased fetal movement   2. NST (non-stress test) reactive   3. Headache in pregnancy   4. Nausea/vomiting in pregnancy     Plan: Discharge home PTL precautions and fetal kick counts Prescription for  Fioricet and Zofran given to patient F/U at [facility] tomorrow Return to MAU as needed    Medication List    ASK your doctor about these medications  acetaminophen 325 MG tablet  Commonly known as:  TYLENOL  Take 650 mg by mouth every 6 (six) hours as needed for pain.     prenatal multivitamin Tabs  Take 1 tablet by mouth daily.     progesterone 200 MG Supp  Place 200 mg vaginally at bedtime.        Wilber Oliphant, student  midwife  06/16/2012 11:51 PM  I have seen this patient and agree with the above midwife student's note.  LEFTWICH-KIRBY, Jeraldin Fesler Certified Nurse-Midwife

## 2012-06-16 NOTE — MAU Note (Signed)
Hasn't felt baby move since yesterday. Headache and nausea all week. No vomiting.

## 2012-06-17 DIAGNOSIS — O368190 Decreased fetal movements, unspecified trimester, not applicable or unspecified: Secondary | ICD-10-CM

## 2012-06-17 LAB — COMPREHENSIVE METABOLIC PANEL
ALT: 7 U/L (ref 0–35)
AST: 12 U/L (ref 0–37)
Albumin: 2.6 g/dL — ABNORMAL LOW (ref 3.5–5.2)
Alkaline Phosphatase: 77 U/L (ref 39–117)
BUN: 7 mg/dL (ref 6–23)
CO2: 22 mEq/L (ref 19–32)
Calcium: 9.1 mg/dL (ref 8.4–10.5)
Chloride: 106 mEq/L (ref 96–112)
Creatinine, Ser: 0.48 mg/dL — ABNORMAL LOW (ref 0.50–1.10)
GFR calc Af Amer: >90 mL/min (ref >90)
GFR calc non Af Amer: >90 mL/min (ref >90)
Glucose, Bld: 107 mg/dL — ABNORMAL HIGH (ref 70–99)
Potassium: 3.6 mEq/L (ref 3.5–5.1)
Sodium: 138 mEq/L (ref 135–145)
Total Bilirubin: 0.3 mg/dL (ref 0.3–1.2)
Total Protein: 6.2 g/dL (ref 6.0–8.3)

## 2012-06-17 LAB — PROTEIN / CREATININE RATIO, URINE
Creatinine, Urine: 124.91 mg/dL
Protein Creatinine Ratio: 0.27 — ABNORMAL HIGH (ref 0.00–0.15)
Total Protein, Urine: 34.1 mg/dL

## 2012-06-17 MED ORDER — BUTALBITAL-APAP-CAFFEINE 50-325-40 MG PO TABS: 1.0000 | ORAL_TABLET | Freq: Four times a day (QID) | ORAL | Status: DC | PRN

## 2012-06-17 MED ORDER — ONDANSETRON HCL 4 MG PO TABS: 4.0000 mg | ORAL_TABLET | Freq: Three times a day (TID) | ORAL | Status: DC | PRN

## 2012-06-17 MED ORDER — BUTALBITAL-APAP-CAFFEINE 50-325-40 MG PO TABS
2.0000 | ORAL_TABLET | ORAL | Status: AC
  Administered 2012-06-17: 2 via ORAL
  Filled 2012-06-17: qty 2

## 2012-06-18 LAB — URINE CULTURE: Colony Count: >=100000

## 2012-07-15 ENCOUNTER — Telehealth: Payer: Self-pay | Admitting: Advanced Practice Midwife

## 2012-07-15 ENCOUNTER — Inpatient Hospital Stay (HOSPITAL_COMMUNITY)
Admission: AD | Admit: 2012-07-15 | Discharge: 2012-07-15 | Disposition: A | Discharge status: Home / Self Care | Payer: Medicaid Other | Source: Ambulatory Visit | Attending: Obstetrics & Gynecology | Admitting: Obstetrics & Gynecology

## 2012-07-15 ENCOUNTER — Inpatient Hospital Stay (HOSPITAL_COMMUNITY): Payer: Medicaid Other

## 2012-07-15 ENCOUNTER — Encounter (HOSPITAL_COMMUNITY): Payer: Self-pay | Admitting: *Deleted

## 2012-07-15 DIAGNOSIS — O4700 False labor before 37 completed weeks of gestation, unspecified trimester: Secondary | ICD-10-CM | POA: Insufficient documentation

## 2012-07-15 DIAGNOSIS — N949 Unspecified condition associated with female genital organs and menstrual cycle: Secondary | ICD-10-CM | POA: Insufficient documentation

## 2012-07-15 DIAGNOSIS — O479 False labor, unspecified: Secondary | ICD-10-CM

## 2012-07-15 LAB — URINE MICROSCOPIC-ADD ON

## 2012-07-15 LAB — URINALYSIS, ROUTINE W REFLEX MICROSCOPIC
Bilirubin Urine: NEGATIVE
Glucose, UA: 250 mg/dL — AB
Ketones, ur: NEGATIVE mg/dL
Leukocytes, UA: NEGATIVE
Nitrite: NEGATIVE
Protein, ur: NEGATIVE mg/dL
Specific Gravity, Urine: 1.025 (ref 1.005–1.030)
Urobilinogen, UA: 0.2 mg/dL (ref 0.0–1.0)
pH: 6 (ref 5.0–8.0)

## 2012-07-15 LAB — WET PREP, GENITAL
Clue Cells Wet Prep HPF POC: NONE SEEN
Trich, Wet Prep: NONE SEEN
Yeast Wet Prep HPF POC: NONE SEEN

## 2012-07-15 LAB — AMNISURE RUPTURE OF MEMBRANE (ROM) NOT AT ARMC: Amnisure ROM: NEGATIVE

## 2012-07-15 NOTE — Telephone Encounter (Signed)
Pt stated that she is having more pressure than usual, and when going to the restroom she has some watery discharge that has a pinkish tinge to it. She denies any gush of fluid and said that she is feeling the baby move. I discussed the above with Dr. Emelda Fear and he advised that the pt should go to [facility] for evaluation. I notified the pt of this and she verbalized understanding.

## 2012-07-15 NOTE — MAU Provider Note (Signed)
History   Pt is a 32 y/o [id number] at [redacted]w[redacted]d presenting with vaginal pain and discharge   CSN: [id number]  Arrival date and time: 07/15/12 2010   None     Chief Complaint  Patient presents with  . Rupture of Membranes   Vaginal Discharge The patient's primary symptoms include pelvic pain and a vaginal discharge. The patient's pertinent negatives include no genital itching, genital odor, genital rash or vaginal bleeding. This is a new problem. The current episode started yesterday. The problem occurs constantly. The problem has been unchanged. The pain is mild. The problem affects both sides. She is pregnant. Associated symptoms include back pain and dysuria. Pertinent negatives include no abdominal pain, chills, constipation, diarrhea, fever, flank pain, frequency or nausea. The vaginal discharge was copious and milky. There has been no bleeding. She has not been passing clots. She has not been passing tissue. Nothing aggravates the symptoms. She has tried nothing for the symptoms. The treatment provided no relief. She is not sexually active. It is unknown whether or not her partner has an STD.    OB History   Grav Para Term Preterm Abortions TAB SAB Ect Mult Living   5 3 3  0 1 0 1 0 0 3      Past Medical History  Diagnosis Date  . Murmur, cardiac   . Gestational HTN     Sept 2012 post partum  . Benign hematuria     Past Surgical History  Procedure Laterality Date  . Cesarean section    . Hand surgery      History reviewed. No pertinent family history.  History  Substance Use Topics  . Smoking status: Never Smoker   . Smokeless tobacco: Not on file  . Alcohol Use: No    Allergies: No Known Allergies  Prescriptions prior to admission  Medication Sig Dispense Refill  . butalbital-acetaminophen-caffeine (FIORICET) 50-325-40 MG per tablet Take 1-2 tablets by mouth every 6 (six) hours as needed for headache.  20 tablet  0  . ondansetron (ZOFRAN) 4 MG tablet Take 1-2  tablets (4-8 mg total) by mouth every 8 (eight) hours as needed for nausea.  20 tablet  0  . Prenatal Vit-Fe Fumarate-FA (PRENATAL MULTIVITAMIN) TABS Take 1 tablet by mouth every other day.       . progesterone 200 MG SUPP Place 200 mg vaginally every other day.         Review of Systems  Constitutional: Negative for fever and chills.  Gastrointestinal: Negative for nausea, abdominal pain, diarrhea and constipation.  Genitourinary: Positive for dysuria, vaginal discharge and pelvic pain. Negative for frequency and flank pain.  Musculoskeletal: Positive for back pain.   Physical Exam   Last menstrual period 11/18/2011.  Physical Exam  Constitutional: She is oriented to person, place, and time. She appears well-developed and well-nourished.  HENT:  Head: Normocephalic and atraumatic.  Eyes: EOM are normal. Pupils are equal, round, and reactive to light.  Neck: Normal range of motion. Neck supple.  Cardiovascular: Normal rate, regular rhythm, normal heart sounds and intact distal pulses.  Exam reveals no gallop and no friction rub.   No murmur heard. Respiratory: Breath sounds normal. No respiratory distress. She has no wheezes. She has no rales.  GI: Soft. Bowel sounds are normal. There is no tenderness. There is no rebound, no guarding and no CVA tenderness.  Genitourinary: Vagina normal.  Sterile speculum exam: negative pooling, moderate white discharge,   Musculoskeletal: Normal range of motion. She exhibits  no edema and no tenderness.  Neurological: She is alert and oriented to person, place, and time.  Skin: Skin is warm and dry.  Psychiatric: She has a normal mood and affect. Her behavior is normal. Thought content normal.   Results for orders placed during the hospital encounter of 07/15/12 (from the past 24 hour(s))  URINALYSIS, ROUTINE W REFLEX MICROSCOPIC     Status: Abnormal   Collection Time    07/15/12  9:20 PM      Result Value Range   Color, Urine YELLOW  YELLOW    APPearance CLEAR  CLEAR   Specific Gravity, Urine 1.025  1.005 - 1.030   pH 6.0  5.0 - 8.0   Glucose, UA 250 (*) NEGATIVE mg/dL   Hgb urine dipstick LARGE (*) NEGATIVE   Bilirubin Urine NEGATIVE  NEGATIVE   Ketones, ur NEGATIVE  NEGATIVE mg/dL   Protein, ur NEGATIVE  NEGATIVE mg/dL   Urobilinogen, UA 0.2  0.0 - 1.0 mg/dL   Nitrite NEGATIVE  NEGATIVE   Leukocytes, UA NEGATIVE  NEGATIVE  WET PREP, GENITAL     Status: Abnormal   Collection Time    07/15/12  9:20 PM      Result Value Range   Yeast Wet Prep HPF POC NONE SEEN  NONE SEEN   Trich, Wet Prep NONE SEEN  NONE SEEN   Clue Cells Wet Prep HPF POC NONE SEEN  NONE SEEN   WBC, Wet Prep HPF POC FEW (*) NONE SEEN  URINE MICROSCOPIC-ADD ON     Status: Abnormal   Collection Time    07/15/12  9:20 PM      Result Value Range   Squamous Epithelial / LPF MANY (*) RARE   WBC, UA 0-2  <3 WBC/hpf   RBC / HPF 7-10  <3 RBC/hpf   Bacteria, UA MANY (*) RARE   Urine-Other MUCOUS PRESENT      MAU Course  Procedures  MDM   Assessment and Plan   Pt is a 32 y/o [id number] at [redacted]w[redacted]d presenting with vaginal pain and discharge   # False labore: Pt mentions 2 days of increased vaginal discharge and concern for SROM.  Physical exam did not have any pooling of amniotic fluid, ferning was negative.  Differential: UTI: negative ua for nitrites and leukocytes, Bacterial vaginosis: negative for clue cells on wet prep  -- cervical length 2.6 cm on u/s (stable from previous, q2week, exams) -- AFI 14.5 cm -- Amniosure negative  -- continue routine prenatal care as previously scheduled -- d/c to home  -- discussed signs of labor   Gregor Hams 07/15/2012, 9:29 PM   I have seen and examined this patient and agree the above assessment. CRESENZO-DISHMAN,Karan Inclan 07/16/2012 12:29 AM

## 2012-07-15 NOTE — MAU Note (Signed)
Pt reports since yesterday she has been "leaking fluid" and having increased pressure. States she first noticed the fluid at 10 am yesterday but has not needed a pad.

## 2012-07-19 ENCOUNTER — Encounter: Payer: Self-pay | Admitting: *Deleted

## 2012-07-20 ENCOUNTER — Ambulatory Visit (INDEPENDENT_AMBULATORY_CARE_PROVIDER_SITE_OTHER): Payer: Medicaid Other | Admitting: Advanced Practice Midwife

## 2012-07-20 ENCOUNTER — Encounter: Payer: Self-pay | Admitting: Advanced Practice Midwife

## 2012-07-20 VITALS — BP 120/70 | Wt 231.0 lb

## 2012-07-20 DIAGNOSIS — O99019 Anemia complicating pregnancy, unspecified trimester: Secondary | ICD-10-CM

## 2012-07-20 DIAGNOSIS — E669 Obesity, unspecified: Secondary | ICD-10-CM

## 2012-07-20 DIAGNOSIS — O26873 Cervical shortening, third trimester: Secondary | ICD-10-CM

## 2012-07-20 DIAGNOSIS — O26879 Cervical shortening, unspecified trimester: Secondary | ICD-10-CM | POA: Insufficient documentation

## 2012-07-20 DIAGNOSIS — N3021 Other chronic cystitis with hematuria: Secondary | ICD-10-CM

## 2012-07-20 DIAGNOSIS — Z3480 Encounter for supervision of other normal pregnancy, unspecified trimester: Secondary | ICD-10-CM

## 2012-07-20 DIAGNOSIS — O09299 Supervision of pregnancy with other poor reproductive or obstetric history, unspecified trimester: Secondary | ICD-10-CM

## 2012-07-20 DIAGNOSIS — Z1389 Encounter for screening for other disorder: Secondary | ICD-10-CM

## 2012-07-20 DIAGNOSIS — O34219 Maternal care for unspecified type scar from previous cesarean delivery: Secondary | ICD-10-CM

## 2012-07-20 DIAGNOSIS — Z331 Pregnant state, incidental: Secondary | ICD-10-CM

## 2012-07-20 DIAGNOSIS — O26872 Cervical shortening, second trimester: Secondary | ICD-10-CM

## 2012-07-20 HISTORY — DX: Other chronic cystitis with hematuria: N30.21

## 2012-07-20 LAB — POCT URINALYSIS DIPSTICK
Blood, UA: 3
Glucose, UA: NEGATIVE
Ketones, UA: NEGATIVE
Leukocytes, UA: NEGATIVE
Nitrite, UA: NEGATIVE
Protein, UA: NEGATIVE

## 2012-07-20 NOTE — Progress Notes (Signed)
Swelling in feet and ankles

## 2012-07-20 NOTE — Progress Notes (Signed)
Seen in MAU 3/20 with 2/6cm cx length, no evidence of ROM. Carrying baby high, FH is not accurate. WIil check EFW with last cervical length u/s next week

## 2012-07-27 ENCOUNTER — Ambulatory Visit (INDEPENDENT_AMBULATORY_CARE_PROVIDER_SITE_OTHER): Payer: Medicaid Other

## 2012-07-27 ENCOUNTER — Other Ambulatory Visit: Payer: Self-pay | Admitting: Obstetrics & Gynecology

## 2012-07-27 ENCOUNTER — Encounter: Payer: Self-pay | Admitting: Advanced Practice Midwife

## 2012-07-27 ENCOUNTER — Ambulatory Visit (INDEPENDENT_AMBULATORY_CARE_PROVIDER_SITE_OTHER): Payer: Medicaid Other | Admitting: Advanced Practice Midwife

## 2012-07-27 ENCOUNTER — Other Ambulatory Visit: Payer: Self-pay | Admitting: Advanced Practice Midwife

## 2012-07-27 VITALS — BP 118/78 | Wt 233.6 lb

## 2012-07-27 DIAGNOSIS — O09299 Supervision of pregnancy with other poor reproductive or obstetric history, unspecified trimester: Secondary | ICD-10-CM

## 2012-07-27 DIAGNOSIS — O99019 Anemia complicating pregnancy, unspecified trimester: Secondary | ICD-10-CM

## 2012-07-27 DIAGNOSIS — O26872 Cervical shortening, second trimester: Secondary | ICD-10-CM

## 2012-07-27 DIAGNOSIS — O26873 Cervical shortening, third trimester: Secondary | ICD-10-CM

## 2012-07-27 DIAGNOSIS — O09623 Supervision of young multigravida, third trimester: Secondary | ICD-10-CM

## 2012-07-27 DIAGNOSIS — Z3483 Encounter for supervision of other normal pregnancy, third trimester: Secondary | ICD-10-CM

## 2012-07-27 DIAGNOSIS — Z1389 Encounter for screening for other disorder: Secondary | ICD-10-CM

## 2012-07-27 DIAGNOSIS — O26879 Cervical shortening, unspecified trimester: Secondary | ICD-10-CM

## 2012-07-27 DIAGNOSIS — Z331 Pregnant state, incidental: Secondary | ICD-10-CM

## 2012-07-27 DIAGNOSIS — O34219 Maternal care for unspecified type scar from previous cesarean delivery: Secondary | ICD-10-CM

## 2012-07-27 DIAGNOSIS — O0993 Supervision of high risk pregnancy, unspecified, third trimester: Secondary | ICD-10-CM

## 2012-07-27 LAB — US OB FOLLOW UP

## 2012-07-27 LAB — POCT URINALYSIS DIPSTICK
Blood, UA: 3
Ketones, UA: NEGATIVE
Nitrite, UA: NEGATIVE
Protein, UA: NEGATIVE

## 2012-07-27 LAB — US OB TRANSVAGINAL

## 2012-07-27 NOTE — Progress Notes (Signed)
U/S-vtx, active fetus, approp growthEFW 5 lb (2272 gms) 64th%tile, fluid wnl AFI=13.15cm, mae fetus ("Summer Padilla"), CX=2.2 cm with small amount of funneling noted (decreased since prev u/s) no change in cx length with fundal pressure applied

## 2012-07-27 NOTE — Patient Instructions (Addendum)
Nothing to eat or drink after midnight the night before your sugar test.  You will be here for at least 2 hours.

## 2012-07-27 NOTE — Progress Notes (Signed)
No c/o at this time.  Routine questions about pregnancy andswered. VBAC consent signed today. Continue prometrium. Thurs for PNII, then F/U in 2 weeks for LROB .

## 2012-07-29 ENCOUNTER — Other Ambulatory Visit: Payer: Medicaid Other

## 2012-07-29 ENCOUNTER — Other Ambulatory Visit: Payer: Self-pay | Admitting: Advanced Practice Midwife

## 2012-07-29 DIAGNOSIS — Z3490 Encounter for supervision of normal pregnancy, unspecified, unspecified trimester: Secondary | ICD-10-CM

## 2012-07-30 LAB — CBC
HCT: 37.1 % (ref 36.0–46.0)
Hemoglobin: 11.9 g/dL — ABNORMAL LOW (ref 12.0–15.0)
MCH: 26.4 pg (ref 26.0–34.0)
MCHC: 32.1 g/dL (ref 30.0–36.0)
MCV: 82.4 fL (ref 78.0–100.0)
Platelets: 200 10*3/uL (ref 150–400)
RBC: 4.5 MIL/uL (ref 3.87–5.11)
RDW: 14.8 % (ref 11.5–15.5)
WBC: 8.6 10*3/uL (ref 4.0–10.5)

## 2012-07-30 LAB — HIV ANTIBODY (ROUTINE TESTING W REFLEX): HIV: NONREACTIVE

## 2012-07-30 LAB — HSV 2 ANTIBODY, IGG: HSV 2 Glycoprotein G Ab, IgG: 0.23 IV

## 2012-07-30 LAB — ANTIBODY SCREEN: Antibody Screen: NEGATIVE

## 2012-07-30 LAB — RPR

## 2012-08-03 ENCOUNTER — Encounter: Payer: Self-pay | Admitting: Advanced Practice Midwife

## 2012-08-03 ENCOUNTER — Other Ambulatory Visit: Payer: Self-pay | Admitting: Advanced Practice Midwife

## 2012-08-03 DIAGNOSIS — O24410 Gestational diabetes mellitus in pregnancy, diet controlled: Secondary | ICD-10-CM | POA: Insufficient documentation

## 2012-08-03 DIAGNOSIS — O0993 Supervision of high risk pregnancy, unspecified, third trimester: Secondary | ICD-10-CM

## 2012-08-03 DIAGNOSIS — O0990 Supervision of high risk pregnancy, unspecified, unspecified trimester: Secondary | ICD-10-CM | POA: Insufficient documentation

## 2012-08-11 ENCOUNTER — Other Ambulatory Visit: Payer: Medicaid Other

## 2012-08-11 ENCOUNTER — Other Ambulatory Visit (INDEPENDENT_AMBULATORY_CARE_PROVIDER_SITE_OTHER): Payer: Medicaid Other | Admitting: Advanced Practice Midwife

## 2012-08-11 ENCOUNTER — Encounter: Payer: Self-pay | Admitting: Advanced Practice Midwife

## 2012-08-11 ENCOUNTER — Other Ambulatory Visit: Payer: Self-pay | Admitting: Obstetrics & Gynecology

## 2012-08-11 ENCOUNTER — Ambulatory Visit (INDEPENDENT_AMBULATORY_CARE_PROVIDER_SITE_OTHER): Payer: Medicaid Other

## 2012-08-11 ENCOUNTER — Ambulatory Visit (INDEPENDENT_AMBULATORY_CARE_PROVIDER_SITE_OTHER): Payer: Medicaid Other | Admitting: Obstetrics & Gynecology

## 2012-08-11 VITALS — BP 158/100 | Wt 234.5 lb

## 2012-08-11 DIAGNOSIS — O368190 Decreased fetal movements, unspecified trimester, not applicable or unspecified: Secondary | ICD-10-CM

## 2012-08-11 DIAGNOSIS — N898 Other specified noninflammatory disorders of vagina: Secondary | ICD-10-CM

## 2012-08-11 DIAGNOSIS — O139 Gestational [pregnancy-induced] hypertension without significant proteinuria, unspecified trimester: Secondary | ICD-10-CM

## 2012-08-11 DIAGNOSIS — O0993 Supervision of high risk pregnancy, unspecified, third trimester: Secondary | ICD-10-CM

## 2012-08-11 DIAGNOSIS — O368390 Maternal care for abnormalities of the fetal heart rate or rhythm, unspecified trimester, not applicable or unspecified: Secondary | ICD-10-CM

## 2012-08-11 DIAGNOSIS — Z1389 Encounter for screening for other disorder: Secondary | ICD-10-CM

## 2012-08-11 DIAGNOSIS — O0990 Supervision of high risk pregnancy, unspecified, unspecified trimester: Secondary | ICD-10-CM

## 2012-08-11 DIAGNOSIS — O99810 Abnormal glucose complicating pregnancy: Secondary | ICD-10-CM

## 2012-08-11 DIAGNOSIS — Z331 Pregnant state, incidental: Secondary | ICD-10-CM

## 2012-08-11 DIAGNOSIS — O24419 Gestational diabetes mellitus in pregnancy, unspecified control: Secondary | ICD-10-CM | POA: Insufficient documentation

## 2012-08-11 DIAGNOSIS — O24410 Gestational diabetes mellitus in pregnancy, diet controlled: Secondary | ICD-10-CM

## 2012-08-11 DIAGNOSIS — O99019 Anemia complicating pregnancy, unspecified trimester: Secondary | ICD-10-CM

## 2012-08-11 DIAGNOSIS — O34219 Maternal care for unspecified type scar from previous cesarean delivery: Secondary | ICD-10-CM

## 2012-08-11 DIAGNOSIS — O26879 Cervical shortening, unspecified trimester: Secondary | ICD-10-CM

## 2012-08-11 DIAGNOSIS — L299 Pruritus, unspecified: Secondary | ICD-10-CM

## 2012-08-11 DIAGNOSIS — O368191 Decreased fetal movements, unspecified trimester, fetus 1: Secondary | ICD-10-CM

## 2012-08-11 LAB — COMPREHENSIVE METABOLIC PANEL
ALT: 8 U/L (ref 0–35)
AST: 18 U/L (ref 0–37)
Albumin: 3.1 g/dL — ABNORMAL LOW (ref 3.5–5.2)
Alkaline Phosphatase: 120 U/L — ABNORMAL HIGH (ref 39–117)
BUN: 6 mg/dL (ref 6–23)
CO2: 20 mEq/L (ref 19–32)
Calcium: 8.8 mg/dL (ref 8.4–10.5)
Chloride: 107 mEq/L (ref 96–112)
Creat: 0.51 mg/dL (ref 0.50–1.10)
Glucose, Bld: 75 mg/dL (ref 70–99)
Potassium: 4.2 mEq/L (ref 3.5–5.3)
Sodium: 137 mEq/L (ref 135–145)
Total Bilirubin: 1 mg/dL (ref 0.3–1.2)
Total Protein: 6 g/dL (ref 6.0–8.3)

## 2012-08-11 LAB — POCT URINALYSIS DIPSTICK
Blood, UA: 3
Glucose, UA: NEGATIVE
Nitrite, UA: NEGATIVE
Protein, UA: 1

## 2012-08-11 LAB — CBC
HCT: 34.5 % — ABNORMAL LOW (ref 36.0–46.0)
Hemoglobin: 11.4 g/dL — ABNORMAL LOW (ref 12.0–15.0)
MCH: 25.9 pg — ABNORMAL LOW (ref 26.0–34.0)
MCHC: 33 g/dL (ref 30.0–36.0)
MCV: 78.2 fL (ref 78.0–100.0)
Platelets: 206 10*3/uL (ref 150–400)
RBC: 4.41 MIL/uL (ref 3.87–5.11)
RDW: 15 % (ref 11.5–15.5)
WBC: 7.7 10*3/uL (ref 4.0–10.5)

## 2012-08-11 MED ORDER — GLYBURIDE 2.5 MG PO TABS: 2.5000 mg | ORAL_TABLET | Freq: Two times a day (BID) | ORAL | Status: DC

## 2012-08-11 NOTE — Patient Instructions (Signed)
Collect every single urine for the next 24 hours.  Keep cold. Bring tomorrow when you come in for your visit.  Begin Glyburide 2.5 mg twice a day.  Start tonight.    If you develop a headache that does not go away with Tylenol, seek treatment.

## 2012-08-11 NOTE — Progress Notes (Signed)
U/S (34+5wks)- active fetus, vtx, fluid wnl AFI-11.95cm, post gr 2 plac, BPP8/10 (NR-NST),FHR=129 BPM

## 2012-08-11 NOTE — Progress Notes (Signed)
C/o contractions with pain, headache last night, clear watery discharge, mucous this am, decrease in FM x 1 week.

## 2012-08-11 NOTE — Progress Notes (Signed)
1. FBS 82-114, 2/7 >90.  2hr PP 121-162;  Will start Glyburide 2.5mg  PO BID 2.  B/P elevated.  148/102 on recheck.  Had HA yesterday that was relieved with 650 mg Tylenol.  DTR 2+.  Will check PreX labs, send home a 24 hour urine, and recheck B/P tomorrow 3.  Decreased FM for ~ 1 week.  NST non reactive.  Will get BPP.  See Note below. 4.  C/O generalized uticaria without rash.  Will check bile acids 5.  C/O yellow mucousy discharge. SSE:  Normal appearing d/c.  Wet prep and fern negative POC discussed with Dr. Despina Hidden who agrees with plan.

## 2012-08-11 NOTE — Assessment & Plan Note (Signed)
1.  FBS:  82-114, 2/7<90.  2hr pp:  121-162.  Will start glyburide 2.5mg  PO BID.  2. B/P elevated. Recheck 148/102. Had a HA last night that was relieved with 650mg  Tylenol.  Denies visual changes, RUQ pain.  DTR's 2+.  Will check preX labs, 24 hour urine collection and recheck B/P tomorrow.   3.  C/O all over itching yesterday, no rash.  Will check bile salts.  4. C/O decreased FM for ~ 1 week.  NST non-reactive.  Will get BPP.   5.C/O mucousy yelllow discharge, lower abdominal pain.  SSE:  Normal appearing d/c; wet prep and fern negative.   POC discussed with Dr. Despina Hidden, who agrees.

## 2012-08-12 ENCOUNTER — Encounter (HOSPITAL_COMMUNITY): Payer: Self-pay | Admitting: *Deleted

## 2012-08-12 ENCOUNTER — Ambulatory Visit (INDEPENDENT_AMBULATORY_CARE_PROVIDER_SITE_OTHER): Payer: Medicaid Other | Admitting: Obstetrics and Gynecology

## 2012-08-12 ENCOUNTER — Telehealth: Payer: Self-pay | Admitting: Obstetrics and Gynecology

## 2012-08-12 ENCOUNTER — Inpatient Hospital Stay (HOSPITAL_COMMUNITY)
Admission: AD | Admit: 2012-08-12 | Discharge: 2012-08-15 | DRG: 774 | Disposition: A | Discharge status: Home / Self Care | Payer: Medicaid Other | Source: Ambulatory Visit | Attending: Obstetrics & Gynecology | Admitting: Obstetrics & Gynecology

## 2012-08-12 VITALS — BP 140/90 | Wt 232.6 lb

## 2012-08-12 DIAGNOSIS — O322XX1 Maternal care for transverse and oblique lie, fetus 1: Secondary | ICD-10-CM

## 2012-08-12 DIAGNOSIS — O1413 Severe pre-eclampsia, third trimester: Secondary | ICD-10-CM

## 2012-08-12 DIAGNOSIS — O34219 Maternal care for unspecified type scar from previous cesarean delivery: Secondary | ICD-10-CM

## 2012-08-12 DIAGNOSIS — O328XX0 Maternal care for other malpresentation of fetus, not applicable or unspecified: Secondary | ICD-10-CM | POA: Diagnosis present

## 2012-08-12 DIAGNOSIS — E669 Obesity, unspecified: Secondary | ICD-10-CM

## 2012-08-12 DIAGNOSIS — O133 Gestational [pregnancy-induced] hypertension without significant proteinuria, third trimester: Secondary | ICD-10-CM

## 2012-08-12 DIAGNOSIS — O139 Gestational [pregnancy-induced] hypertension without significant proteinuria, unspecified trimester: Secondary | ICD-10-CM | POA: Diagnosis present

## 2012-08-12 DIAGNOSIS — O0993 Supervision of high risk pregnancy, unspecified, third trimester: Secondary | ICD-10-CM

## 2012-08-12 DIAGNOSIS — O0990 Supervision of high risk pregnancy, unspecified, unspecified trimester: Secondary | ICD-10-CM

## 2012-08-12 DIAGNOSIS — O1414 Severe pre-eclampsia complicating childbirth: Principal | ICD-10-CM | POA: Diagnosis present

## 2012-08-12 DIAGNOSIS — O1410 Severe pre-eclampsia, unspecified trimester: Secondary | ICD-10-CM

## 2012-08-12 DIAGNOSIS — O99210 Obesity complicating pregnancy, unspecified trimester: Secondary | ICD-10-CM

## 2012-08-12 DIAGNOSIS — O99814 Abnormal glucose complicating childbirth: Secondary | ICD-10-CM | POA: Diagnosis present

## 2012-08-12 DIAGNOSIS — O24410 Gestational diabetes mellitus in pregnancy, diet controlled: Secondary | ICD-10-CM

## 2012-08-12 DIAGNOSIS — Z1389 Encounter for screening for other disorder: Secondary | ICD-10-CM

## 2012-08-12 DIAGNOSIS — Z331 Pregnant state, incidental: Secondary | ICD-10-CM

## 2012-08-12 HISTORY — DX: Type 2 diabetes mellitus without complications: E11.9

## 2012-08-12 LAB — PROTEIN / CREATININE RATIO, URINE
Creatinine, Urine: 176.3 mg/dL
Protein Creatinine Ratio: 0.1 (ref <0.15)
Total Protein, Urine: 18 mg/dL

## 2012-08-12 LAB — POCT URINALYSIS DIPSTICK
Glucose, UA: NEGATIVE
Ketones, UA: NEGATIVE
Nitrite, UA: NEGATIVE
Protein, UA: NEGATIVE

## 2012-08-12 LAB — PROTEIN, URINE, 24 HOUR
Protein, 24H Urine: 257 mg/d — ABNORMAL HIGH (ref 50–100)
Protein, Urine: 13 mg/dL

## 2012-08-12 MED ORDER — CYCLOBENZAPRINE HCL 10 MG PO TABS: 5.0000 mg | ORAL_TABLET | Freq: Once | ORAL | Status: DC

## 2012-08-12 MED ORDER — LABETALOL HCL 100 MG PO TABS: 100.0000 mg | ORAL_TABLET | Freq: Two times a day (BID) | ORAL | Status: DC

## 2012-08-12 MED ORDER — CYCLOBENZAPRINE HCL 10 MG PO TABS
10.0000 mg | ORAL_TABLET | Freq: Once | ORAL | Status: AC
  Administered 2012-08-12: 10 mg via ORAL
  Filled 2012-08-12 (×2): qty 1

## 2012-08-12 MED ORDER — OXYCODONE-ACETAMINOPHEN 5-325 MG PO TABS
2.0000 | ORAL_TABLET | Freq: Once | ORAL | Status: AC
  Administered 2012-08-12: 2 via ORAL
  Filled 2012-08-12: qty 2

## 2012-08-12 NOTE — Addendum Note (Signed)
Addended by: Criss Alvine on: 08/12/2012 01:11 PM   Modules accepted: Orders

## 2012-08-12 NOTE — Patient Instructions (Signed)
Reduce glyburide to 2.5 mg in the morning. Begin Labetolol 100 mg twice daily [facility]ify us [facility] severe headaches right upper abdominal pain, or other concerns. Continue twice weekly visits for diabetes, fetal monitoring and reevaluation.

## 2012-08-12 NOTE — Progress Notes (Signed)
Chief Complaint:  Headache and Pruritis   Summer Padilla is  32 y.o. [phone] at [redacted]w[redacted]d presents complaining of Headache and Pruritis .  She states none contractions are associated with none vaginal bleeding, intact membranes, along with active fetal movement. Pt has been seen in the office for the past few days evaluating her for GHTN vs Preeclampsia.  Her labs today were negative for preeclampsia.  She was given a Rx for Labetolol 100mg  BID, which she took this afternoon.  She has had a HA all day today in the top of her head, no visual changes.  She also started itching all over yesterday, no rash.  Bile acids were drawn but results are not back yet.    Obstetrical/Gynecological History: Menstrual History: OB History   Grav Para Term Preterm Abortions TAB SAB Ect Mult Living   5 3 3  0 1 0 1 0 0 3       Patient's last menstrual period was 11/18/2011.     Past Medical History: Past Medical History  Diagnosis Date  . Murmur, cardiac   . Gestational HTN     Sept 2012 post partum  . Benign hematuria   . Diabetes mellitus without complication     Past Surgical History: Past Surgical History  Procedure Laterality Date  . Cesarean section    . Hand surgery      Family History: Family History  Problem Relation Age of Onset  . Cancer Mother     cervical  . Diabetes Maternal Aunt   . Hearing loss Maternal Grandmother     Social History: History  Substance Use Topics  . Smoking status: Never Smoker   . Smokeless tobacco: Not on file  . Alcohol Use: No    Allergies: No Known Allergies  Meds:  Prescriptions prior to admission  Medication Sig Dispense Refill  . acetaminophen (TYLENOL) 325 MG tablet Take 650 mg by mouth every 6 (six) hours as needed for pain (headacahe).      . glyBURIDE (DIABETA) 2.5 MG tablet Take 1 tablet (2.5 mg total) by mouth 2 (two) times daily with a meal.  60 tablet  3  . labetalol (NORMODYNE) 100 MG tablet Take 1 tablet (100 mg total)  by mouth 2 (two) times daily.  45 tablet  1  . ondansetron (ZOFRAN) 4 MG tablet Take 1-2 tablets (4-8 mg total) by mouth every 8 (eight) hours as needed for nausea.  20 tablet  0  . Prenatal Vit-Fe Fumarate-FA (PRENATAL MULTIVITAMIN) TABS Take 1 tablet by mouth every other day.       . progesterone 200 MG SUPP Place 200 mg vaginally every other day.         Review of Systems -   Review of Systems  Constitutional: Negative for fever, chills, weight loss, malaise/fatigue and diaphoresis.  HENT: Negative for hearing loss, ear pain, nosebleeds, congestion, sore throat, neck pain, tinnitus and ear discharge.   Eyes: Negative for blurred vision, double vision, photophobia, pain, discharge and redness.  Respiratory: Negative for cough, hemoptysis, sputum production, shortness of breath, wheezing and stridor.   Cardiovascular: Negative for chest pain, palpitations, orthopnea,  leg swelling  Gastrointestinal: Negative for abdominal pain heartburn, nausea, vomiting, diarrhea, constipation, blood in stool Genitourinary: Negative for dysuria, urgency, frequency, hematuria and flank pain.  Musculoskeletal: Negative for myalgias, back pain, joint pain and falls. + Trigger Point Skin: Positive for itching "all over"  Negative for rash.  Neurological: Negative for dizziness, tingling, tremors,  sensory change, speech change, focal weakness, seizures, loss of consciousness, weakness Positive for headaches.  Endo/Heme/Allergies: Negative for environmental allergies and polydipsia. Does not bruise/bleed easily.  Psychiatric/Behavioral: Negative for depression, suicidal ideas, hallucinations, memory loss and substance abuse. The patient is not nervous/anxious and does not have insomnia.      Physical Exam  Blood pressure 167/110, pulse 101, temperature 98.2 F (36.8 C), temperature source Oral, resp. rate 18, height 5\' 3"  (1.6 m), weight 233 lb (105.688 kg), last menstrual period 11/18/2011, SpO2  99.00%. GENERAL: Well-developed, well-nourished female in no acute distress.  LUNGS: Clear to auscultation bilaterally.  HEART: Regular rate and rhythm. ABDOMEN: Soft, nontender, nondistended, gravid.  EXTREMITIES: Nontender, trace edema, .3-4+ DTR's, negative clonus FHT:  Baseline rate 130 bpm   Variability moderate  Accelerations present   Decelerations none Contractions: Every 0 mins  Filed Vitals:   08/12/12 2356  BP: 167/110  Pulse: 101  Temp:   Resp:       Labs: Results for orders placed in visit on 08/12/12 (from the past 24 hour(s))  POCT URINALYSIS DIPSTICK   Collection Time    08/12/12 12:00 PM      Result Value Range   Color, UA amber     Clarity, UA clear     Glucose, UA neg     Bilirubin, UA       Ketones, UA neg     Spec Grav, UA       Blood, UA 4+     pH, UA       Protein, UA neg     Urobilinogen, UA       Nitrite, UA neg     Leukocytes, UA Trace    PROTEIN, URINE, 24 HOUR   Collection Time    08/12/12  1:13 PM      Result Value Range   Protein, Urine 13     Protein, 24H Urine 257 (*) 50 - 100 mg/day   Pt was given 2 percocet and a flexaril which did not touch her HA.   IAssessment: Summer Padilla is  32 y.o. [phone] at [redacted]w[redacted]d presents with severe preeclampsia based on Severe HA/severe range B/P Plan: IOL.  Foley>Pitocin; MgSO4 CRESENZO-DISHMAN,Jaelon Gatley 4/18/201412:06 AM

## 2012-08-12 NOTE — Progress Notes (Signed)
Continues to have headaches.

## 2012-08-12 NOTE — Telephone Encounter (Signed)
Pt asking if she can take tylenol for her headaches, explained to pt can take tylenol for headaches. Pt states was given RX for B/P medications today, explained to pt if headache is coming from elevated B/P that the B/P meds might help relieve headache also. Pt verbalized understanding.

## 2012-08-12 NOTE — MAU Note (Signed)
Pt reports headache since yesterday, today began having "numbness" on top of her head. Also started having itching all over her body, and feels like she is trembling. Started Labetalol today at 4:30 pm.

## 2012-08-12 NOTE — Progress Notes (Signed)
Phone call to patient made, 24 hr TP returns at 257 mg/24 hours. Patient aware. Pt being seen at [facility] this PM due to headache symptoms .

## 2012-08-12 NOTE — Telephone Encounter (Signed)
Labs for 24 hr TP reported to pt at 257 mg/24 hr.  Pt being seen tonight at [facility].

## 2012-08-12 NOTE — Progress Notes (Signed)
F/u Gest HTN, headache. PIH labs: Pr/Cr 0.10. LFT  Normal plts 200k, S: still mild h/a, no other sx.Imp gest Htn: Rx Labetolol 100 bid. Being seen 2x weekly. followup 24 hr TP submitted today.  jvf

## 2012-08-12 NOTE — MAU Note (Signed)
Pt complaining of headache and numbness in her headache. Pt states she has been having  Headaches off and on, and they started about 1930.

## 2012-08-13 ENCOUNTER — Inpatient Hospital Stay (HOSPITAL_COMMUNITY): Payer: Medicaid Other | Admitting: Anesthesiology

## 2012-08-13 ENCOUNTER — Encounter (HOSPITAL_COMMUNITY): Payer: Self-pay | Admitting: Anesthesiology

## 2012-08-13 ENCOUNTER — Encounter (HOSPITAL_COMMUNITY): Payer: Self-pay | Admitting: *Deleted

## 2012-08-13 DIAGNOSIS — O34219 Maternal care for unspecified type scar from previous cesarean delivery: Secondary | ICD-10-CM

## 2012-08-13 DIAGNOSIS — O99814 Abnormal glucose complicating childbirth: Secondary | ICD-10-CM

## 2012-08-13 DIAGNOSIS — O322XX0 Maternal care for transverse and oblique lie, not applicable or unspecified: Secondary | ICD-10-CM

## 2012-08-13 DIAGNOSIS — O328XX0 Maternal care for other malpresentation of fetus, not applicable or unspecified: Secondary | ICD-10-CM

## 2012-08-13 DIAGNOSIS — O1414 Severe pre-eclampsia complicating childbirth: Secondary | ICD-10-CM

## 2012-08-13 LAB — COMPREHENSIVE METABOLIC PANEL
ALT: 9 U/L (ref 0–35)
AST: 19 U/L (ref 0–37)
Albumin: 2.8 g/dL — ABNORMAL LOW (ref 3.5–5.2)
Alkaline Phosphatase: 135 U/L — ABNORMAL HIGH (ref 39–117)
BUN: 6 mg/dL (ref 6–23)
CO2: 18 mEq/L — ABNORMAL LOW (ref 19–32)
Calcium: 9.5 mg/dL (ref 8.4–10.5)
Chloride: 102 mEq/L (ref 96–112)
Creatinine, Ser: 0.46 mg/dL — ABNORMAL LOW (ref 0.50–1.10)
GFR calc Af Amer: >90 mL/min (ref >90)
GFR calc non Af Amer: >90 mL/min (ref >90)
Glucose, Bld: 72 mg/dL (ref 70–99)
Potassium: 3.9 mEq/L (ref 3.5–5.1)
Sodium: 135 mEq/L (ref 135–145)
Total Bilirubin: 0.8 mg/dL (ref 0.3–1.2)
Total Protein: 6.5 g/dL (ref 6.0–8.3)

## 2012-08-13 LAB — URINALYSIS, ROUTINE W REFLEX MICROSCOPIC
Bilirubin Urine: NEGATIVE
Glucose, UA: NEGATIVE mg/dL
Ketones, ur: >80 mg/dL — AB
Leukocytes, UA: NEGATIVE
Nitrite: NEGATIVE
Protein, ur: NEGATIVE mg/dL
Specific Gravity, Urine: 1.02 (ref 1.005–1.030)
Urobilinogen, UA: 0.2 mg/dL (ref 0.0–1.0)
pH: 6.5 (ref 5.0–8.0)

## 2012-08-13 LAB — CBC
HCT: 32.1 % — ABNORMAL LOW (ref 36.0–46.0)
HCT: 36.2 % (ref 36.0–46.0)
HCT: 36.5 % (ref 36.0–46.0)
Hemoglobin: 10.5 g/dL — ABNORMAL LOW (ref 12.0–15.0)
Hemoglobin: 11.6 g/dL — ABNORMAL LOW (ref 12.0–15.0)
Hemoglobin: 11.8 g/dL — ABNORMAL LOW (ref 12.0–15.0)
MCH: 26.4 pg (ref 26.0–34.0)
MCH: 26.8 pg (ref 26.0–34.0)
MCH: 26.8 pg (ref 26.0–34.0)
MCHC: 31.8 g/dL (ref 30.0–36.0)
MCHC: 32.6 g/dL (ref 30.0–36.0)
MCHC: 32.7 g/dL (ref 30.0–36.0)
MCV: 81.9 fL (ref 78.0–100.0)
MCV: 82.3 fL (ref 78.0–100.0)
MCV: 83 fL (ref 78.0–100.0)
Platelets: 191 10*3/uL (ref 150–400)
Platelets: 194 10*3/uL (ref 150–400)
Platelets: 202 10*3/uL (ref 150–400)
RBC: 3.92 MIL/uL (ref 3.87–5.11)
RBC: 4.4 MIL/uL (ref 3.87–5.11)
RBC: 4.4 MIL/uL (ref 3.87–5.11)
RDW: 14.3 % (ref 11.5–15.5)
RDW: 14.5 % (ref 11.5–15.5)
RDW: 14.5 % (ref 11.5–15.5)
WBC: 11.1 10*3/uL — ABNORMAL HIGH (ref 4.0–10.5)
WBC: 8.7 10*3/uL (ref 4.0–10.5)
WBC: 9.1 10*3/uL (ref 4.0–10.5)

## 2012-08-13 LAB — TYPE AND SCREEN
ABO/RH(D): O POS
Antibody Screen: NEGATIVE

## 2012-08-13 LAB — GLUCOSE, CAPILLARY
Glucose-Capillary: 73 mg/dL (ref 70–99)
Glucose-Capillary: 107 mg/dL — ABNORMAL HIGH (ref 70–99)
Glucose-Capillary: 77 mg/dL (ref 70–99)
Glucose-Capillary: 77 mg/dL (ref 70–99)
Glucose-Capillary: 81 mg/dL (ref 70–99)
Glucose-Capillary: 119 mg/dL — ABNORMAL HIGH (ref 70–99)

## 2012-08-13 LAB — GROUP B STREP BY PCR: Group B strep by PCR: NEGATIVE

## 2012-08-13 LAB — URINE MICROSCOPIC-ADD ON

## 2012-08-13 LAB — OB RESULTS CONSOLE GBS: GBS: NEGATIVE

## 2012-08-13 LAB — RPR: RPR Ser Ql: NONREACTIVE

## 2012-08-13 LAB — BILE ACIDS, TOTAL: Bile Acids Total: 7 umol/L (ref 0–19)

## 2012-08-13 MED ORDER — TERBUTALINE SULFATE 1 MG/ML IJ SOLN: 0.2500 mg | Freq: Once | INTRAMUSCULAR | Status: AC | PRN

## 2012-08-13 MED ORDER — OXYTOCIN 40 UNITS IN LACTATED RINGERS INFUSION - SIMPLE MED
1.0000 m[IU]/min | INTRAVENOUS | Status: DC
  Administered 2012-08-13: 2 m[IU]/min via INTRAVENOUS
  Filled 2012-08-13: qty 1000

## 2012-08-13 MED ORDER — LACTATED RINGERS IV SOLN
INTRAVENOUS | Status: DC
  Administered 2012-08-13: 01:00:00 via INTRAVENOUS

## 2012-08-13 MED ORDER — DIPHENHYDRAMINE HCL 50 MG/ML IJ SOLN: 12.5000 mg | INTRAMUSCULAR | Status: DC | PRN

## 2012-08-13 MED ORDER — PHENYLEPHRINE 40 MCG/ML (10ML) SYRINGE FOR IV PUSH (FOR BLOOD PRESSURE SUPPORT): 80.0000 ug | PREFILLED_SYRINGE | INTRAVENOUS | Status: DC | PRN

## 2012-08-13 MED ORDER — LACTATED RINGERS IV SOLN
500.0000 mL | Freq: Once | INTRAVENOUS | Status: AC
Start: 2012-08-13 — End: 2012-08-13
  Administered 2012-08-13: 1000 mL via INTRAVENOUS

## 2012-08-13 MED ORDER — EPHEDRINE 5 MG/ML INJ: 10.0000 mg | INTRAVENOUS | Status: DC | PRN

## 2012-08-13 MED ORDER — LIDOCAINE HCL (PF) 1 % IJ SOLN
30.0000 mL | INTRAMUSCULAR | Status: DC | PRN
  Filled 2012-08-13: qty 30

## 2012-08-13 MED ORDER — OXYTOCIN 40 UNITS IN LACTATED RINGERS INFUSION - SIMPLE MED
62.5000 mL/h | INTRAVENOUS | Status: DC
  Filled 2012-08-13: qty 1000

## 2012-08-13 MED ORDER — NALBUPHINE SYRINGE 5 MG/0.5 ML
5.0000 mg | INJECTION | INTRAMUSCULAR | Status: DC | PRN
  Administered 2012-08-13: 5 mg via INTRAVENOUS
  Filled 2012-08-13: qty 0.5

## 2012-08-13 MED ORDER — ACETAMINOPHEN 325 MG PO TABS: 650.0000 mg | ORAL_TABLET | Freq: Four times a day (QID) | ORAL | Status: DC | PRN

## 2012-08-13 MED ORDER — ZOLPIDEM TARTRATE 5 MG PO TABS
5.0000 mg | ORAL_TABLET | Freq: Every evening | ORAL | Status: DC | PRN
  Administered 2012-08-13: 5 mg via ORAL
  Filled 2012-08-13: qty 1

## 2012-08-13 MED ORDER — FENTANYL CITRATE 0.05 MG/ML IJ SOLN
50.0000 ug | INTRAMUSCULAR | Status: DC | PRN
  Administered 2012-08-13 (×3): 50 ug via INTRAVENOUS
  Filled 2012-08-13 (×2): qty 2

## 2012-08-13 MED ORDER — IBUPROFEN 600 MG PO TABS
600.0000 mg | ORAL_TABLET | Freq: Four times a day (QID) | ORAL | Status: DC | PRN
  Administered 2012-08-14: 600 mg via ORAL
  Filled 2012-08-13: qty 1

## 2012-08-13 MED ORDER — EPHEDRINE 5 MG/ML INJ
10.0000 mg | INTRAVENOUS | Status: DC | PRN
  Filled 2012-08-13: qty 4

## 2012-08-13 MED ORDER — MAGNESIUM SULFATE BOLUS VIA INFUSION
4.0000 g | Freq: Once | INTRAVENOUS | Status: AC
  Administered 2012-08-13: 4 g via INTRAVENOUS
  Filled 2012-08-13: qty 500

## 2012-08-13 MED ORDER — OXYCODONE-ACETAMINOPHEN 5-325 MG PO TABS: 1.0000 | ORAL_TABLET | Freq: Four times a day (QID) | ORAL | Status: DC | PRN

## 2012-08-13 MED ORDER — LABETALOL HCL 5 MG/ML IV SOLN: 20.0000 mg | INTRAVENOUS | Status: DC | PRN

## 2012-08-13 MED ORDER — OXYCODONE-ACETAMINOPHEN 5-325 MG PO TABS
1.0000 | ORAL_TABLET | ORAL | Status: DC | PRN
  Administered 2012-08-14: 1 via ORAL
  Filled 2012-08-13: qty 1

## 2012-08-13 MED ORDER — ACETAMINOPHEN 325 MG PO TABS: 650.0000 mg | ORAL_TABLET | ORAL | Status: DC | PRN

## 2012-08-13 MED ORDER — GLYBURIDE 2.5 MG PO TABS
2.5000 mg | ORAL_TABLET | Freq: Two times a day (BID) | ORAL | Status: DC
  Filled 2012-08-13: qty 1

## 2012-08-13 MED ORDER — ONDANSETRON HCL 4 MG/2ML IJ SOLN: 4.0000 mg | Freq: Four times a day (QID) | INTRAMUSCULAR | Status: DC | PRN

## 2012-08-13 MED ORDER — LABETALOL HCL 100 MG PO TABS
100.0000 mg | ORAL_TABLET | Freq: Two times a day (BID) | ORAL | Status: DC
  Administered 2012-08-13 – 2012-08-14 (×3): 100 mg via ORAL
  Filled 2012-08-13 (×6): qty 1

## 2012-08-13 MED ORDER — LACTATED RINGERS IV SOLN
500.0000 mL | INTRAVENOUS | Status: DC | PRN
  Administered 2012-08-13: 500 mL via INTRAVENOUS

## 2012-08-13 MED ORDER — MISOPROSTOL 200 MCG PO TABS
ORAL_TABLET | ORAL | Status: AC
  Administered 2012-08-13: 1000 ug via RECTAL
  Filled 2012-08-13: qty 5

## 2012-08-13 MED ORDER — MAGNESIUM SULFATE 40 G IN LACTATED RINGERS - SIMPLE
2.0000 g/h | INTRAVENOUS | Status: DC
  Filled 2012-08-13 (×2): qty 500

## 2012-08-13 MED ORDER — CITRIC ACID-SODIUM CITRATE 334-500 MG/5ML PO SOLN
30.0000 mL | ORAL | Status: DC | PRN
  Filled 2012-08-13: qty 15

## 2012-08-13 MED ORDER — OXYTOCIN BOLUS FROM INFUSION
500.0000 mL | INTRAVENOUS | Status: DC
  Administered 2012-08-13: 500 mL via INTRAVENOUS

## 2012-08-13 MED ORDER — FENTANYL 2.5 MCG/ML BUPIVACAINE 1/10 % EPIDURAL INFUSION (WH - ANES)
14.0000 mL/h | INTRAMUSCULAR | Status: DC | PRN
  Administered 2012-08-13: 14 mL/h via EPIDURAL
  Filled 2012-08-13 (×2): qty 125

## 2012-08-13 MED ORDER — PHENYLEPHRINE 40 MCG/ML (10ML) SYRINGE FOR IV PUSH (FOR BLOOD PRESSURE SUPPORT)
80.0000 ug | PREFILLED_SYRINGE | INTRAVENOUS | Status: DC | PRN
  Filled 2012-08-13: qty 5

## 2012-08-13 NOTE — Anesthesia Preprocedure Evaluation (Addendum)
Anesthesia Evaluation  Patient identified by MRN, date of birth, ID band Patient awake    Reviewed: Allergy & Precautions, H&P , Patient's Chart, lab work & pertinent test results  Airway Mallampati: III TM Distance: >3 FB Neck ROM: full    Dental  (+) Teeth Intact   Pulmonary  breath sounds clear to auscultation        Cardiovascular hypertension, Rhythm:regular Rate:Normal     Neuro/Psych    GI/Hepatic   Endo/Other  diabetesMorbid obesity  Renal/GU      Musculoskeletal   Abdominal   Peds  Hematology   Anesthesia Other Findings  Gestational HTN     Diabetes mellitus without complication      Morbid osesity         Reproductive/Obstetrics (+) Pregnancy                           Anesthesia Physical Anesthesia Plan  ASA: III  Anesthesia Plan: Epidural   Post-op Pain Management:    Induction:   Airway Management Planned:   Additional Equipment:   Intra-op Plan:   Post-operative Plan:   Informed Consent: I have reviewed the patients History and Physical, chart, labs and discussed the procedure including the risks, benefits and alternatives for the proposed anesthesia with the patient or authorized representative who has indicated his/her understanding and acceptance.   Dental Advisory Given  Plan Discussed with:   Anesthesia Plan Comments: (Labs checked- platelets confirmed with RN in room. Fetal heart tracing, per RN, reported to be stable enough for sitting procedure. Discussed epidural, and patient consents to the procedure:  included risk of possible headache,backache, failed block, allergic reaction, and nerve injury. This patient was asked if she had any questions or concerns before the procedure started. )        Anesthesia Quick Evaluation

## 2012-08-13 NOTE — Progress Notes (Signed)
Faculty Practice OB/GYN Attending Note  Subjective:  Called to evaluate patient with recently diagnosed fetal malpresentation during induction of labor for severe preeclampsia.  I performed a bedside ultrasound which showed fetus in double footling breech presentation, head to maternal RUQ, back to the left. FHR reactive, no LOF or vaginal bleeding. Good FM.  Pitocin has been turned off; patient is on magnesium sulfate eclampsia prophylaxis.   Objective:  Blood pressure 139/73, pulse 92, temperature 98.3 F (36.8 C), temperature source Oral, resp. rate 18, height 5\' 3"  (1.6 m), weight 233 lb (105.688 kg), last menstrual period 11/18/2011, SpO2 99.00%. FHT  Baseline 135 bpm, moderate variability, +accelerations, no decelerations Toco: every 1- 4 cm minutes Gen: NAD Abdomen: NT, obese, gravid fundus, soft Cervix: 5 cm dilated on RN exam  Procedure Patient was counseled about external cephalic version (ECV) versus cesarean section.  Risks reviewed, she opted for trial of version, OR and Anesthesia notified.  Three attempts were made to turn the baby, head went down to the RLQ but migrated back upwards to RUQ  after each attempt.  FHR remained reassuring throughout the version attempts.  After the third attempt, the procedure was stopped.   Assessment & Plan:  32 y.o. [id number] at [redacted]w[redacted]d admitted for induction of labor for severe preeclampsia, now with double footling breech presentation s/p failed ECV.  Patient was given the options of cesarean section at this point  Versus another ECV trial by my partner (Dr. Shawnie Pons) who is going to be here at 0800; she may also be able to evaluate her for possibility of vaginal breech delivery.  Recommended patient obtain epidural as this will help her discomfort and also help in being ready  In the event cesarean delivery is needed.  Will continue magnesium sulfate, halt induction for now.  Jaynie Collins, MD, FACOG Attending Obstetrician & Gynecologist Faculty  Practice, [facility]

## 2012-08-13 NOTE — Progress Notes (Signed)
Summer Padilla is a 32 y.o. [phone] at [redacted]w[redacted]d  Subjective: Feeling rectal pressure with ctx  Objective: BP 115/44  Pulse 109  Temp(Src) 98.6 F (37 C) (Axillary)  Resp 20  Ht 5\' 3"  (1.6 m)  Wt 233 lb (105.688 kg)  BMI 41.28 kg/m2  SpO2 100%  LMP 11/18/2011 I/O last 3 completed shifts: In: 4481.3 [P.O.:1230; I.V.:3251.3] Out: 2000 [Urine:2000] Total I/O In: 130 [P.O.:30; I.V.:100] Out: 250 [Urine:250]  FHT:  FHR: 120 bpm, variability: moderate,  accelerations:  Present,  decelerations:  Absent UC:   regular, every 2-3 minutes with Pitocin at 62mu/min SVE:   Dilation: 7 Effacement (%): 80 Station: -2 Exam by:: Pincus Badder, CNM AROM- clear/blood-tinged; vtx well applied to cx after rupture  Labs: Lab Results  Component Value Date   WBC 9.1 08/13/2012   HGB 11.6* 08/13/2012   HCT 36.5 08/13/2012   MCV 83.0 08/13/2012   PLT 202 08/13/2012    Assessment / Plan: Active labor TOLAC Preeclampsia on magnesium A2DM- CBGs stable  Continue assessing for labor progression Use epidural PCA for comfort   Summer Padilla 08/13/2012, 8:41 PM

## 2012-08-13 NOTE — Progress Notes (Signed)
   Summer Padilla is a 32 y.o. [phone] at [redacted]w[redacted]d  admitted for induction of labor due to Pre-eclamptic toxemia of pregnancy..  Subjective:  Still C/O headache Objective: BP 130/116  Pulse 98  Temp(Src) 98.4 F (36.9 C) (Oral)  Resp 18  Ht 5\' 3"  (1.6 m)  Wt 233 lb (105.688 kg)  BMI 41.28 kg/m2  SpO2 99%  LMP 11/18/2011    FHT:  FHR: 140 bpm, variability: moderate,  accelerations:  Present,  decelerations:  Absent UC:   none SVE:    1-2/20/-3 Foley placed and inflated with 60cc H20  Labs: Lab Results  Component Value Date   WBC 8.7 08/13/2012   HGB 11.8* 08/13/2012   HCT 36.2 08/13/2012   MCV 82.3 08/13/2012   PLT 194 08/13/2012    Assessment / Plan: IOL for severe preeclampsia, ripening phase  Labor: no Fetal Wellbeing:  Category I Pain Control:  Fentanyl for HA Anticipated MOD:  NSVD  CRESENZO-DISHMAN,Mynor Witkop 08/13/2012, 1:38 AM

## 2012-08-13 NOTE — Progress Notes (Signed)
Dr. Shawnie Pons in to see pt, discussed attempting external cephalic version. Pt in agreement with plan and gave verbal & written consent.  MD also discussed method of delivery if version is unsuccessful. Pt states prefers C/section delivery, & pt gave verbal & written consent for this.  See MD's procedure note for version. Pt tolerated well & fhr was reassuring when checked with ultrasound before, during, & after procedure.  Abdominal binder was placed once procedure done.

## 2012-08-13 NOTE — Progress Notes (Signed)
Dr. Macon Large at bedside discussing with pt options of external cephalic version due to baby being double footling breech, discussing risks and benefits, pt verbalizes understanding and taking time to discuss options with family

## 2012-08-13 NOTE — Progress Notes (Signed)
Patient ID: Dunya Meiners, female   DOB: [birth year], 32 y.o.   MRN: [id number]  Patient ID: Tracie Harrier, female   DOB: 05/20/1983, 32 y.o.   MRN: [id number]   S:  Pt comfortable, Has headache, partially relieved by fentanyl, some pressure  O:   Filed Vitals:   08/13/12 1331 08/13/12 1401 08/13/12 1431 08/13/12 1501  BP: 118/67 124/63 132/65 112/64  Pulse: 93 85 98 95  Temp:      TempSrc:      Resp: 20 20 18 20   Height:      Weight:      SpO2:         Cervix:  5/60/-3  FHTs:  135, moderate variability, accels present, no decels. Cat I TOCO:  q 2-4 minutes  A/P 29 y.o. [id number] at [redacted]w[redacted]d Continue to increase pitocin  Napoleon Form, MD

## 2012-08-13 NOTE — Progress Notes (Signed)
Foley came out and pt 5/50 now footling breech.  Dr. Sarina Ill attempted ECV without success.  Baby tolerated it well.  Pt will now get an epidural and most likely will try another ECV with DR. Shawnie Pons.  B/P's very labile, 94-150/50/100.  Has not required any IV labetalol.  HA is coming back.  Pt to get IV pain medicine.

## 2012-08-13 NOTE — Progress Notes (Signed)
Patient ID: Summer Padilla, female   DOB: [birth year], 32 y.o.   MRN: [id number] After informed verbal consent, ECV was attempted under Ultrasound guidance.  Baby was transverse, back up.  ECV successful and abdominal binder placed.   FHR was reactive before and after the procedure.   Pt. Tolerated the procedure well.

## 2012-08-13 NOTE — H&P (Signed)
Chief Complaint: Headache and Pruritis   Summer Padilla is 32 y.o. [phone] at [redacted]w[redacted]d presents complaining of Headache and Pruritis  . She states none contractions are associated with none vaginal bleeding, intact membranes, along with active fetal movement.  Pt has been seen in the office for the past few days evaluating her for GHTN vs Preeclampsia. Her labs today were negative for preeclampsia. She was given a Rx for Labetolol 100mg  BID, which she took this afternoon. She has had a HA all day today in the top of her head, no visual changes. She also started itching all over yesterday, no rash. Bile acids were drawn but results are not back yet.  Obstetrical/Gynecological History:  Menstrual History:  OB History    Grav  Para  Term  Preterm  Abortions  TAB  SAB  Ect  Mult  Living    5  3  3   0  1  0  1  0  0  3      Patient's last menstrual period was 11/18/2011.   Past Medical History:  Past Medical History   Diagnosis  Date   .  Murmur, cardiac    .  Gestational HTN      Sept 2012 post partum   .  Benign hematuria    .  Diabetes mellitus without complication     Past Surgical History:  Past Surgical History   Procedure  Laterality  Date   .  Cesarean section     .  Hand surgery      Family History:  Family History   Problem  Relation  Age of Onset   .  Cancer  Mother      cervical   .  Diabetes  Maternal Aunt    .  Hearing loss  Maternal Grandmother     Social History:  History   Substance Use Topics   .  Smoking status:  Never Smoker   .  Smokeless tobacco:  Not on file   .  Alcohol Use:  No    Allergies: No Known Allergies  Meds:  Prescriptions prior to admission   Medication  Sig  Dispense  Refill   .  acetaminophen (TYLENOL) 325 MG tablet  Take 650 mg by mouth every 6 (six) hours as needed for pain (headacahe).     .  glyBURIDE (DIABETA) 2.5 MG tablet  Take 1 tablet (2.5 mg total) by mouth 2 (two) times daily with a meal.  60 tablet  3   .  labetalol  (NORMODYNE) 100 MG tablet  Take 1 tablet (100 mg total) by mouth 2 (two) times daily.  45 tablet  1   .  ondansetron (ZOFRAN) 4 MG tablet  Take 1-2 tablets (4-8 mg total) by mouth every 8 (eight) hours as needed for nausea.  20 tablet  0   .  Prenatal Vit-Fe Fumarate-FA (PRENATAL MULTIVITAMIN) TABS  Take 1 tablet by mouth every other day.     .  progesterone 200 MG SUPP  Place 200 mg vaginally every other day.      Review of Systems -  Review of Systems  Constitutional: Negative for fever, chills, weight loss, malaise/fatigue and diaphoresis.  HENT: Negative for hearing loss, ear pain, nosebleeds, congestion, sore throat, neck pain, tinnitus and ear discharge.  Eyes: Negative for blurred vision, double vision, photophobia, pain, discharge and redness.  Respiratory: Negative for cough, hemoptysis, sputum production, shortness of breath, wheezing and stridor.  Cardiovascular: Negative  for chest pain, palpitations, orthopnea, leg swelling  Gastrointestinal: Negative for abdominal pain heartburn, nausea, vomiting, diarrhea, constipation, blood in stool  Genitourinary: Negative for dysuria, urgency, frequency, hematuria and flank pain.  Musculoskeletal: Negative for myalgias, back pain, joint pain and falls. + Trigger Point  Skin: Positive for itching "all over" Negative for rash.  Neurological: Negative for dizziness, tingling, tremors, sensory change, speech change, focal weakness, seizures, loss of consciousness, weakness Positive for headaches.  Endo/Heme/Allergies: Negative for environmental allergies and polydipsia. Does not bruise/bleed easily.  Psychiatric/Behavioral: Negative for depression, suicidal ideas, hallucinations, memory loss and substance abuse. The patient is not nervous/anxious and does not have insomnia.  Physical Exam   Blood pressure 167/110, pulse 101, temperature 98.2 F (36.8 C), temperature source Oral, resp. rate 18, height 5\' 3"  (1.6 m), weight 233 lb (105.688 kg), last  menstrual period 11/18/2011, SpO2 99.00%.  GENERAL: Well-developed, well-nourished female in no acute distress.  LUNGS: Clear to auscultation bilaterally.  HEART: Regular rate and rhythm.  ABDOMEN: Soft, nontender, nondistended, gravid.  EXTREMITIES: Nontender, trace edema, .3-4+ DTR's, negative clonus  FHT: Baseline rate 130 bpm Variability moderate Accelerations present Decelerations none  Contractions: Every 0 mins  Filed Vitals:    08/12/12 2356   BP:  167/110   Pulse:  101   Temp:    Resp:      Prenatal Labs:    O+ Rubella immune HBSaG neg HIV neg RPR neg GBS pending 2 hr GTT 99/206/162  Labs:  Results for orders placed in visit on 08/12/12 (from the past 24 hour(s))   POCT URINALYSIS DIPSTICK    Collection Time    08/12/12 12:00 PM   Result  Value  Range    Color, UA  amber     Clarity, UA  clear     Glucose, UA  neg     Bilirubin, UA      Ketones, UA  neg     Spec Grav, UA      Blood, UA  4+     pH, UA      Protein, UA  neg     Urobilinogen, UA      Nitrite, UA  neg     Leukocytes, UA  Trace    PROTEIN, URINE, 24 HOUR    Collection Time    08/12/12 1:13 PM   Result  Value  Range    Protein, Urine  13     Protein, 24H Urine  257 (*)  50 - 100 mg/day    Pt was given 2 percocet and a flexaril which did not touch her HA.  IAssessment:  Summer Padilla is 32 y.o. [phone] at [redacted]w[redacted]d presents with severe preeclampsia based on Severe HA/severe range B/P  Plan:  IOL. Foley>Pitocin; MgSO4; GBS PCR CRESENZO-DISHMAN,Quentin Strebel  4/18/201412:06 AM

## 2012-08-14 ENCOUNTER — Encounter (HOSPITAL_COMMUNITY): Payer: Self-pay | Admitting: *Deleted

## 2012-08-14 LAB — MRSA PCR SCREENING: MRSA by PCR: NEGATIVE

## 2012-08-14 MED ORDER — MAGNESIUM SULFATE 40 G IN LACTATED RINGERS - SIMPLE
2.0000 g/h | INTRAVENOUS | Status: DC
  Filled 2012-08-14: qty 500

## 2012-08-14 MED ORDER — DIPHENHYDRAMINE HCL 25 MG PO CAPS
25.0000 mg | ORAL_CAPSULE | Freq: Four times a day (QID) | ORAL | Status: DC | PRN
  Administered 2012-08-15: 25 mg via ORAL
  Filled 2012-08-14: qty 1

## 2012-08-14 MED ORDER — BENZOCAINE-MENTHOL 20-0.5 % EX AERO
1.0000 "application " | INHALATION_SPRAY | CUTANEOUS | Status: DC | PRN
  Filled 2012-08-14 (×2): qty 56

## 2012-08-14 MED ORDER — PROGESTERONE 200 MG VA SUPP: 200.0000 mg | VAGINAL | Status: DC

## 2012-08-14 MED ORDER — SENNOSIDES-DOCUSATE SODIUM 8.6-50 MG PO TABS
2.0000 | ORAL_TABLET | Freq: Every day | ORAL | Status: DC
  Administered 2012-08-14: 2 via ORAL

## 2012-08-14 MED ORDER — DIBUCAINE 1 % RE OINT
1.0000 "application " | TOPICAL_OINTMENT | RECTAL | Status: DC | PRN
  Filled 2012-08-14: qty 28

## 2012-08-14 MED ORDER — ONDANSETRON HCL 4 MG PO TABS: 4.0000 mg | ORAL_TABLET | ORAL | Status: DC | PRN

## 2012-08-14 MED ORDER — LACTATED RINGERS IV SOLN
INTRAVENOUS | Status: DC
  Administered 2012-08-14 (×2): via INTRAVENOUS

## 2012-08-14 MED ORDER — TETANUS-DIPHTH-ACELL PERTUSSIS 5-2.5-18.5 LF-MCG/0.5 IM SUSP
0.5000 mL | Freq: Once | INTRAMUSCULAR | Status: AC
  Administered 2012-08-14: 0.5 mL via INTRAMUSCULAR
  Filled 2012-08-14: qty 0.5

## 2012-08-14 MED ORDER — LANOLIN HYDROUS EX OINT: TOPICAL_OINTMENT | CUTANEOUS | Status: DC | PRN

## 2012-08-14 MED ORDER — ONDANSETRON HCL 4 MG/2ML IJ SOLN: 4.0000 mg | INTRAMUSCULAR | Status: DC | PRN

## 2012-08-14 MED ORDER — PRENATAL MULTIVITAMIN CH
1.0000 | ORAL_TABLET | Freq: Every day | ORAL | Status: DC
  Administered 2012-08-14 – 2012-08-15 (×2): 1 via ORAL
  Filled 2012-08-14 (×2): qty 1

## 2012-08-14 MED ORDER — FLEET ENEMA 7-19 GM/118ML RE ENEM: 1.0000 | ENEMA | RECTAL | Status: DC | PRN

## 2012-08-14 MED ORDER — ZOLPIDEM TARTRATE 5 MG PO TABS: 5.0000 mg | ORAL_TABLET | Freq: Every evening | ORAL | Status: DC | PRN

## 2012-08-14 MED ORDER — WITCH HAZEL-GLYCERIN EX PADS: 1.0000 "application " | MEDICATED_PAD | CUTANEOUS | Status: DC | PRN

## 2012-08-14 MED ORDER — SIMETHICONE 80 MG PO CHEW: 80.0000 mg | CHEWABLE_TABLET | ORAL | Status: DC | PRN

## 2012-08-14 MED ORDER — IBUPROFEN 600 MG PO TABS
600.0000 mg | ORAL_TABLET | Freq: Four times a day (QID) | ORAL | Status: DC
  Administered 2012-08-14 – 2012-08-15 (×6): 600 mg via ORAL
  Filled 2012-08-14 (×5): qty 1

## 2012-08-14 MED ORDER — OXYCODONE-ACETAMINOPHEN 5-325 MG PO TABS
1.0000 | ORAL_TABLET | ORAL | Status: DC | PRN
  Administered 2012-08-14: 1 via ORAL
  Administered 2012-08-14: 2 via ORAL
  Filled 2012-08-14: qty 1
  Filled 2012-08-14: qty 2

## 2012-08-14 NOTE — Progress Notes (Signed)
Summer Padilla, CNM notified of SVE and pt having urge to push; informed of having epidural redosed without much relief and Labetalol dose not given at 2200 due to epidural being redosed and monitoring BP's closely. Will hold off on giving Labetalol dose for now.

## 2012-08-14 NOTE — Anesthesia Postprocedure Evaluation (Signed)
Anesthesia Post Note  Patient: Summer Padilla  Procedure(s) Performed: * No procedures listed *  Anesthesia type: Epidural  Patient location: Mother/Baby  Post pain: Pain level controlled  Post assessment: Post-op Vital signs reviewed  Last Vitals:  Filed Vitals:   08/14/12 0700  BP: 123/58  Pulse: 83  Temp:   Resp: 18    Post vital signs: Reviewed  Level of consciousness:alert  Complications: No apparent anesthesia complications

## 2012-08-14 NOTE — H&P (Signed)
Attestation of Attending Supervision of Advanced Practitioner (PA/CNM/NP): Evaluation and management procedures were performed by the Advanced Practitioner under my supervision and collaboration.  I have reviewed the Advanced Practitioner's note and chart, and I agree with the management and plan.  Kirstin Kugler, MD, FACOG Attending Obstetrician & Gynecologist Faculty Practice, [facility]  

## 2012-08-14 NOTE — Progress Notes (Signed)
Post Partum Day 1 Subjective: no complaints, voiding and tolerating PO  Objective: Blood pressure 123/58, pulse 83, temperature 98.2 F (36.8 C), temperature source Oral, resp. rate 18, height 5\' 3"  (1.6 m), weight 231 lb 6.4 oz (104.962 kg), last menstrual period 11/18/2011, SpO2 97.00%, unknown if currently breastfeeding. I/O-+2.3 L  Physical Exam:  General: alert, cooperative and appears stated age 32: appropriate Uterine Fundus: firm DVT Evaluation: No evidence of DVT seen on physical exam.   Recent Labs  08/13/12 0733 08/13/12 2348  HGB 11.6* 10.5*  HCT 36.5 32.1*    Assessment/Plan: Plan for discharge tomorrow, Breastfeeding and Contraception undecided. Continue pp Magnesium until 24 hour pp.     LOS: 2 days   Jaden Batchelder S 08/14/2012, 7:09 AM

## 2012-08-15 DIAGNOSIS — O34219 Maternal care for unspecified type scar from previous cesarean delivery: Secondary | ICD-10-CM

## 2012-08-15 DIAGNOSIS — O1410 Severe pre-eclampsia, unspecified trimester: Secondary | ICD-10-CM

## 2012-08-15 MED ORDER — IBUPROFEN 600 MG PO TABS: 600.0000 mg | ORAL_TABLET | Freq: Four times a day (QID) | ORAL | Status: DC

## 2012-08-15 NOTE — Progress Notes (Signed)
Post Partum Day 2 Subjective: up ad lib, voiding, tolerating PO and + flatus, starting to get headache, mild lower extremity edema  Objective: Blood pressure 123/88, pulse 93, temperature 98.2 F (36.8 C), temperature source Oral, resp. rate 18, height 5\' 3"  (1.6 m), weight 104.962 kg (231 lb 6.4 oz), last menstrual period 11/18/2011, SpO2 100.00%, unknown if currently breastfeeding.  Filed Vitals:   08/15/12 0105 08/15/12 0217 08/15/12 0316 08/15/12 0625  BP: 101/78 129/72 127/74 123/88  Pulse: 93 84 91 93  Temp:   98.2 F (36.8 C)   TempSrc:   Oral   Resp: 20 20 18 18   Height:      Weight:      SpO2: 99% 99% 99% 100%     Physical Exam:  General: alert, cooperative and no distress Lochia: appropriate Uterine Fundus: firm Incision: n/a DVT Evaluation: No evidence of DVT seen on physical exam. Negative Homan's sign. No cords or calf tenderness. Calf/Ankle edema is present - mild   Recent Labs  08/13/12 0733 08/13/12 2348  HGB 11.6* 10.5*  HCT 36.5 32.1*    Assessment/Plan: Breastfeeding and Contraception vasectomy Plan for discharge home later today if headache improved Blood pressures well controlled without mediactions   LOS: 3 days   Napoleon Form 08/15/2012, 8:23 AM

## 2012-08-15 NOTE — Discharge Summary (Signed)
Obstetric Discharge Summary Summer Padilla is a 32 y.o. [id number] presenting at [redacted]w[redacted]d with headache and severe range blood pressures. She was diagnosed with severe preeclampsia and proceeded to induction of labor. She had a successful VBAC with no complications. She was on magnesium infusion during labor and for 24 hours post-partum. Her blood pressures postpartum were somewhat labile with most in normal range (120s-130s/70-80s) and a few SBP over 140. She complained of headache post-partum that was relieved by tylenol. She is breastfeeding. Husband plans vasectomy for birth control. Patient also had A2 GDM and was on glyburide antepartum. Blood sugars were well controlled during labor.  Reason for Admission: induction of labor and severe preeclampsia Prenatal Procedures: NST and Preeclampsia Intrapartum Procedures: spontaneous vaginal delivery and VBAC, magnesium infusion Postpartum Procedures: none Complications-Operative and Postpartum: none Hemoglobin  Date Value Range Status  08/13/2012 10.5* 12.0 - 15.0 g/dL Final     HCT  Date Value Range Status  08/13/2012 32.1* 36.0 - 46.0 % Final    Physical Exam:  General: alert, cooperative and no distress Lochia: appropriate Uterine Fundus: firm Incision: n/a DVT Evaluation: No evidence of DVT seen on physical exam. Negative Homan's sign. No cords or calf tenderness. Calf/Ankle edema is present  Discharge Diagnoses: Preelampsia and with preterm delivery, VBAC  Discharge Information: Date: 08/15/2012 Activity: pelvic rest Diet: routine Medications: PNV and Ibuprofen Condition: stable Instructions: refer to practice specific booklet Discharge to: home Follow-up Information   Follow up with [facility]. Schedule an appointment as soon as possible for a visit in 1 day. (For blood pressure check)    Contact information:   [address] [place] [address] [phone]      Newborn Data: Live born female  Birth Weight:  6 lb 3.8 oz (2830 g) APGAR: 9, 9  Baby remains in hospital but will go home with mother when released by peds.  Napoleon Form 08/15/2012, 10:43 AM

## 2012-08-16 ENCOUNTER — Encounter: Payer: Self-pay | Admitting: *Deleted

## 2012-08-16 ENCOUNTER — Telehealth: Payer: Self-pay | Admitting: Women's Health

## 2012-08-16 ENCOUNTER — Encounter: Payer: Medicaid Other | Admitting: Obstetrics and Gynecology

## 2012-08-16 DIAGNOSIS — O1413 Severe pre-eclampsia, third trimester: Secondary | ICD-10-CM

## 2012-08-16 MED ORDER — HYDROCHLOROTHIAZIDE 25 MG PO TABS: 25.0000 mg | ORAL_TABLET | Freq: Every day | ORAL | Status: DC

## 2012-08-16 MED ORDER — FENTANYL 2.5 MCG/ML BUPIVACAINE 1/10 % EPIDURAL INFUSION (WH - ANES)
INTRAMUSCULAR | Status: DC | PRN
  Administered 2012-08-13: 14 mL/h via EPIDURAL

## 2012-08-16 MED ORDER — SODIUM BICARBONATE 8.4 % IV SOLN
INTRAVENOUS | Status: DC | PRN
  Administered 2012-08-13: 5 mL via EPIDURAL

## 2012-08-16 NOTE — Anesthesia Procedure Notes (Addendum)
Epidural Patient location during procedure: OB Start time: 08/13/2012 8:33 AM  Preanesthetic Checklist Completed: patient identified, site marked, surgical consent, pre-op evaluation, timeout performed, IV checked, risks and benefits discussed and monitors and equipment checked  Epidural Patient position: sitting Prep: site prepped and draped and DuraPrep Patient monitoring: continuous pulse ox and blood pressure Approach: midline Injection technique: LOR air  Needle:  Needle type: Tuohy  Needle gauge: 17 G Needle length: 9 cm and 9 Needle insertion depth: 5 cm cm Catheter type: closed end flexible Catheter size: 19 Gauge Catheter at skin depth: 10 cm Test dose: negative  Assessment Events: blood not aspirated, injection not painful, no injection resistance, negative IV test and no paresthesia  Additional Notes Dosing of Epidural:  1st dose, through catheter ............................................Marland Kitchen epi 1:200K + Xylocaine 40 mg  2nd dose, through catheter, after waiting 3 minutes...Marland KitchenMarland Kitchenepi 1:200K + Xylocaine 60 mg    ( 2% Xylo charted as a single dose in Epic Meds for ease of charting; actual dosing was fractionated as above, for saftey's sake)  As each dose occurred, patient was free of IV sx; and patient exhibited no evidence of SA injection.  Patient is more comfortable after epidural dosed. Please see RN's note for documentation of vital signs,and FHR which are stable.  Patient reminded not to try to ambulate with numb legs, and that an RN must be present when she attempts to get up.

## 2012-08-16 NOTE — Telephone Encounter (Signed)
Pt states vaginal delivery on Friday, August 13, 2012, having swelling in lower extremities. Encouraged pt to decrease salt intake, and to elevate extremities as much as possible.  Offered pt appt to be evaluated for swelling, stated baby was still hospitalized and was not able to come to an appt.   1. Do you want to call in RX for swelling?

## 2012-08-17 NOTE — Telephone Encounter (Signed)
Pt informed of RX for HCTZ e-scribed to [facility] in [facility], Appt made for pt to follow up with Dr. Emelda Fear on Friday, August 20, 2012 for postpartum swelling, pt states not able to come sooner due to baby still in [facility].

## 2012-08-17 NOTE — Progress Notes (Signed)
Post discharge chart review completed.  

## 2012-08-20 ENCOUNTER — Encounter: Payer: Self-pay | Admitting: Obstetrics and Gynecology

## 2012-08-20 ENCOUNTER — Ambulatory Visit (INDEPENDENT_AMBULATORY_CARE_PROVIDER_SITE_OTHER): Payer: Medicaid Other | Admitting: Obstetrics and Gynecology

## 2012-08-20 VITALS — BP 142/80 | Wt 225.8 lb

## 2012-08-20 DIAGNOSIS — R609 Edema, unspecified: Secondary | ICD-10-CM

## 2012-08-20 DIAGNOSIS — O1413 Severe pre-eclampsia, third trimester: Secondary | ICD-10-CM

## 2012-08-20 DIAGNOSIS — O1415 Severe pre-eclampsia, complicating the puerperium: Secondary | ICD-10-CM

## 2012-08-20 DIAGNOSIS — IMO0002 Reserved for concepts with insufficient information to code with codable children: Secondary | ICD-10-CM

## 2012-08-20 DIAGNOSIS — Z1389 Encounter for screening for other disorder: Secondary | ICD-10-CM

## 2012-08-20 LAB — POCT URINALYSIS DIPSTICK
Blood, UA: 3
Glucose, UA: NEGATIVE
Ketones, UA: NEGATIVE
Nitrite, UA: NEGATIVE
Protein, UA: NEGATIVE

## 2012-08-20 MED ORDER — HYDROCHLOROTHIAZIDE 25 MG PO TABS: 25.0000 mg | ORAL_TABLET | Freq: Every day | ORAL | Status: DC

## 2012-08-20 NOTE — Patient Instructions (Signed)
Preeclampsia and Eclampsia Preeclampsia is a condition of high blood pressure during pregnancy. It can happen at 20 weeks or later in pregnancy. If high blood pressure occurs in the second half of pregnancy with no other symptoms, it is called gestational hypertension and goes away after the baby is born. If any of the symptoms listed below develop with gestational hypertension, it is then called preeclampsia. Eclampsia (convulsions) may follow preeclampsia. This is one of the reasons for regular prenatal checkups. Early diagnosis and treatment are very important to prevent eclampsia. CAUSES  There is no known cause of preeclampsia/eclampsia in pregnancy. There are several known conditions that may put the pregnant woman at risk, such as:  The first pregnancy.  Having preeclampsia in a past pregnancy.  Having lasting (chronic) high blood pressure.  Having multiples (twins, triplets).  Being age 35 or older.  African American ethnic background.  Having kidney disease or diabetes.  Medical conditions such as lupus or blood diseases.  Being overweight (obese). SYMPTOMS   High blood pressure.  Headaches.  Sudden weight gain.  Swelling of hands, face, legs, and feet.  Protein in the urine.  Feeling sick to your stomach (nauseous) and throwing up (vomiting).  Vision problems (blurred or double vision).  Numbness in the face, arms, legs, and feet.  Dizziness.  Slurred speech.  Preeclampsia can cause growth retardation in the fetus.  Separation (abruption) of the placenta.  Not enough fluid in the amniotic sac (oligohydramnios).  Sensitivity to bright lights.  Belly (abdominal) pain. DIAGNOSIS  If protein is found in the urine in the second half of pregnancy, this is considered preeclampsia. Other symptoms mentioned above may also be present. TREATMENT  It is necessary to treat this.  Your caregiver may prescribe bed rest early in this condition. Plenty of rest and  salt restriction may be all that is needed.  Medicines may be necessary to lower blood pressure if the condition does not respond to more conservative measures.  In more severe cases, hospitalization may be needed:  For treatment of blood pressure.  To control fluid retention.  To monitor the baby to see if the condition is causing harm to the baby.  Hospitalization is the best way to treat the first sign of preeclampsia. This is so the mother and baby can be watched closely and blood tests can be done effectively and correctly.  If the condition becomes severe, it may be necessary to induce labor or to remove the infant by surgical means (cesarean section). The best cure for preeclampsia/eclampsia is to deliver the baby. Preeclampsia and eclampsia involve risks to mother and infant. Your caregiver will discuss these risks with you. Together, you can work out the best possible approach to your problems. Make sure you keep your prenatal visits as scheduled. Not keeping appointments could result in a chronic or permanent injury, pain, disability to you, and death or injury to you or your unborn baby. If there is any problem keeping the appointment, you must call to reschedule. HOME CARE INSTRUCTIONS   Keep your prenatal appointments and tests as scheduled.  Tell your caregiver if you have any of the above risk factors.  Get plenty of rest and sleep.  Eat a balanced diet that is low in salt, and do not add salt to your food.  Avoid stressful situations.  Only take over-the-counter and prescriptions medicines for pain, discomfort, or fever as directed by your caregiver. SEEK IMMEDIATE MEDICAL CARE IF:   You develop severe swelling   anywhere in the body. This usually occurs in the legs.  You gain 5 lb/2.3 kg or more in a week.  You develop a severe headache, dizziness, problems with your vision, or confusion.  You have abdominal pain, nausea, or vomiting.  You have a seizure.  You  have trouble moving any part of your body, or you develop numbness or problems speaking.  You have bruising or abnormal bleeding from anywhere in the body.  You develop a stiff neck.  You pass out. MAKE SURE YOU:   Understand these instructions.  Will watch your condition.  Will get help right away if you are not doing well or get worse. Document Released: 04/11/2000 Document Revised: 07/07/2011 Document Reviewed: 11/26/2007 [employer] Patient Information 2013 [employer].  

## 2012-08-20 NOTE — Progress Notes (Signed)
  Subjective:     Summer Padilla is a 32 y.o. female who presents for a postpartum visit. She is 7 days postpartum following a spontaneous vaginal delivery. I have fully reviewed the prenatal and intrapartum course. The delivery was at 35 gestational weeks. Outcome: spontaneous vaginal delivery. Anesthesia: . Postpartum course has been notable for headache. Baby's course has been simple.  Review of Systems Pertinent items are noted in HPI.   Objective:    BP 142/80  Wt 225 lb 12.8 oz (102.422 kg)  BMI 40.01 kg/m2  LMP 11/18/2011  Breastfeeding? Yes  General:  alert, cooperative and no distress           Abdomen: soft, non-tender; bowel sounds normal; no masses,  no organomegaly  Reflexes 1+               Rectal Exam:         Assessment:     DAY 7 postpartum exam. RESIDUAL hYPERTENSION  Plan:  1 Add HCTz x 7 days, followup  1 week 2.  3. Follow up in: 1 week or as needed.

## 2012-09-23 ENCOUNTER — Other Ambulatory Visit: Payer: Self-pay | Admitting: Obstetrics & Gynecology

## 2012-09-23 ENCOUNTER — Ambulatory Visit (INDEPENDENT_AMBULATORY_CARE_PROVIDER_SITE_OTHER): Payer: Medicaid Other | Admitting: Advanced Practice Midwife

## 2012-09-23 ENCOUNTER — Encounter: Payer: Self-pay | Admitting: Advanced Practice Midwife

## 2012-09-23 VITALS — BP 108/72 | Ht 64.0 in | Wt 218.0 lb

## 2012-09-23 DIAGNOSIS — O2390 Unspecified genitourinary tract infection in pregnancy, unspecified trimester: Secondary | ICD-10-CM

## 2012-09-23 DIAGNOSIS — L299 Pruritus, unspecified: Secondary | ICD-10-CM

## 2012-09-23 LAB — POCT URINALYSIS DIPSTICK
Glucose, UA: NEGATIVE
Ketones, UA: NEGATIVE
Nitrite, UA: NEGATIVE

## 2012-09-23 MED ORDER — CLINDAMYCIN PHOSPHATE 100 MG VA SUPP: 100.0000 mg | Freq: Every day | VAGINAL | Status: DC

## 2012-09-23 NOTE — Patient Instructions (Signed)
Check your Fasting blood sugar (before you eat or drink in the morning) twice a week for 2 weeks.  Let me know if results are more than 90.

## 2012-09-23 NOTE — Progress Notes (Signed)
Summer Padilla is a 32 y.o. who presents for a postpartum visit. She is 6 weeks postpartum following a spontaneous vaginal delivery. I have fully reviewed the prenatal and intrapartum course. She was induced for severe preeclampsia. The delivery was at 35 gestational weeks. In the middle of her IOL, the baby turned breech.  She underwent a successful ECV. Anesthesia: epidural. Postpartum course has been uncomplicated.  She is not on any B/P meds.  Baby has reflux, difficulty with milk. Baby is feeding by breast and bottle. Bleeding: no bleeding. Bowel function is normal. Bladder function is normal. Patient is not sexually active. Contraception method is vasectomy. Postpartum depression screening: negative.   Review of Systems Pertinent items are noted in HPI.   Objective:    There were no vitals taken for this visit.  General:  alert, cooperative and no distress   Breasts:  negative  Lungs: clear to auscultation bilaterally  Heart:  regular rate and rhythm  Abdomen: Soft, nontender   Vulva:  normal  Vagina: Frothy yellow discharge with amine odor.  + clue cells, no WBC/Trich  Cervix:  closed  Corpus: Well involuted     Rectal Exam: No  hemorrhoids        Assessment:    normal postpartum exam.  Plan:   Cleocin Ovules for BV  1. Contraception: vasectomy 2. Follow up in:   as needed.

## 2013-03-03 ENCOUNTER — Other Ambulatory Visit: Payer: Self-pay

## 2014-02-27 ENCOUNTER — Encounter: Payer: Self-pay | Admitting: Advanced Practice Midwife

## 2018-04-28 NOTE — L&D Delivery Note (Signed)
OB[facility]N [facility] Delivery Note  Summer Padilla is a 38 y.o. [id number] s/p vaginal delivery at [redacted]w[redacted]d. She was admitted for IOL 2/2 Pre-eclampsia with severe features (headache and RUQ pain).   ROM: 5h 24m with blood tinged fluid GBS Status: negative Maximum Maternal Temperature: 98.4 F  Delivery Date/Time: 02/07/19 at 2156 Delivery: Called to room because patient was feeling more pressure after having been positioned on hands and knees. Head delivered LOA, restituting LOT. No nuchal cord present. Shoulder and body delivered in usual fashion. Infant with spontaneous cry, placed on mother's abdomen, dried and stimulated. Cord clamped x 2 after 1-minute delay, and cut by FOB under my direct supervision. Cord blood drawn. Placenta delivered spontaneously with gentle cord traction. Labia, perineum, vagina, and cervix were inspected, a right periurethral abrasion and perineal abrasion were seen, and found to be hemostatic, not requiring repair.   Fundus was firm with fundal massage, however the lower uterine segment was somewhat boggy. 1000 mcg PR cytotec was placed. The patient still had some brisk bleeding, even with fundal massage, so a uterine sweep was performed and she was given 1 g TXA. After 10 minutes of fundal massage and another vaginal sweep to evacuate any blood clots, bleeding had significantly diminished.  Placenta: intact with 3 vessel cord Complications: PPH, requiring manual sweep, 1000 mcg PR cytotec, 1 g TXA Lacerations: right periurethral and perineal abrasions, hemostatic, not requiring repair QBL: 865 Analgesia: epidural  Postpartum Planning [x]  message to sent to schedule follow-up  [x]  vaccines UTD  Infant: female  APGARs 8 & 9  weight per medical record  Merilyn Baba, DO OB/GYN Fellow, Faculty Practice

## 2018-08-09 ENCOUNTER — Encounter: Payer: Self-pay | Admitting: Obstetrics & Gynecology

## 2018-08-09 ENCOUNTER — Other Ambulatory Visit: Payer: Self-pay

## 2018-08-09 ENCOUNTER — Ambulatory Visit (INDEPENDENT_AMBULATORY_CARE_PROVIDER_SITE_OTHER): Payer: Self-pay | Admitting: Obstetrics & Gynecology

## 2018-08-09 VITALS — BP 137/83 | HR 99 | Wt 219.0 lb

## 2018-08-09 DIAGNOSIS — Z1151 Encounter for screening for human papillomavirus (HPV): Secondary | ICD-10-CM

## 2018-08-09 DIAGNOSIS — O09529 Supervision of elderly multigravida, unspecified trimester: Secondary | ICD-10-CM | POA: Insufficient documentation

## 2018-08-09 DIAGNOSIS — O09521 Supervision of elderly multigravida, first trimester: Secondary | ICD-10-CM

## 2018-08-09 DIAGNOSIS — Z8632 Personal history of gestational diabetes: Secondary | ICD-10-CM

## 2018-08-09 DIAGNOSIS — Z113 Encounter for screening for infections with a predominantly sexual mode of transmission: Secondary | ICD-10-CM

## 2018-08-09 DIAGNOSIS — O34219 Maternal care for unspecified type scar from previous cesarean delivery: Secondary | ICD-10-CM

## 2018-08-09 DIAGNOSIS — Z124 Encounter for screening for malignant neoplasm of cervix: Secondary | ICD-10-CM

## 2018-08-09 DIAGNOSIS — Z8759 Personal history of other complications of pregnancy, childbirth and the puerperium: Secondary | ICD-10-CM | POA: Insufficient documentation

## 2018-08-09 DIAGNOSIS — Z3A09 9 weeks gestation of pregnancy: Secondary | ICD-10-CM

## 2018-08-09 DIAGNOSIS — O09299 Supervision of pregnancy with other poor reproductive or obstetric history, unspecified trimester: Secondary | ICD-10-CM | POA: Insufficient documentation

## 2018-08-09 DIAGNOSIS — O99210 Obesity complicating pregnancy, unspecified trimester: Secondary | ICD-10-CM

## 2018-08-09 DIAGNOSIS — O09891 Supervision of other high risk pregnancies, first trimester: Secondary | ICD-10-CM

## 2018-08-09 DIAGNOSIS — O99211 Obesity complicating pregnancy, first trimester: Secondary | ICD-10-CM

## 2018-08-09 DIAGNOSIS — O09291 Supervision of pregnancy with other poor reproductive or obstetric history, first trimester: Secondary | ICD-10-CM

## 2018-08-09 MED ORDER — ESCITALOPRAM OXALATE 10 MG PO TABS: 10.0000 mg | ORAL_TABLET | Freq: Every day | ORAL | 12 refills | Status: DC

## 2018-08-09 MED ORDER — PRENATAL MULTIVITAMIN CH: 1.0000 | ORAL_TABLET | ORAL | 12 refills | Status: DC

## 2018-08-09 MED ORDER — ASPIRIN EC 81 MG PO TBEC: 81.0000 mg | DELAYED_RELEASE_TABLET | Freq: Every day | ORAL | 5 refills | Status: DC

## 2018-08-09 NOTE — Addendum Note (Signed)
Addended by: Kathie Dike on: 08/09/2018 03:41 PM   Modules accepted: Orders

## 2018-08-09 NOTE — Progress Notes (Signed)
  Subjective:    Summer Padilla is being seen today for her first obstetrical visit.  This is not a planned pregnancy. She is at [redacted]w[redacted]d gestation. Her obstetrical history is significant for GDM with 2nd pregnancy,  advanced maternal age, obesity and pre-eclampsia (severe in the last pregnancy), C/S followed by VBAC x 2, and anxiety (treated by Dr. Willaim Bane in [place]). Relationship with FOB: significant other, living together. Patient does intend to breast feed. She breast fed all 4 kids.  Pregnancy history fully reviewed.  Patient reports some anxiety and depression. She is taking xanax about 1 time q 2 weeks to sleep. .  Review of Systems:   Review of Systems She has not taken the propanalol since last week when she got the + pregnancy test. Working from home, reviews cases for [employer]  Objective:     BP 137/83   Pulse 99   Wt 219 lb (99.3 kg)   LMP 05/15/2018   BMI 37.59 kg/m  Physical Exam  Exam Breathing, conversing, and ambulating normally Well nourished, well hydrated Summer Padilla, no apparent distress  Heart- rrr Breast- normal bilaterally Abd- benign, obese, scars from a tummy tuck Cervix- appears normal, pap smear obtained   Assessment:    Pregnancy: [id number] Patient Active Problem List   Diagnosis Date Noted  . Other chronic cystitis with hematuria 07/20/2012       Plan:     Initial labs drawn. Prenatal vitamins. Problem list reviewed and updated. She declines genetic screening.  Role of ultrasound in pregnancy discussed; fetal survey: ordered. Amniocentesis discussed: declined. Baby Scripts OPX Flu vaccine today Pap smear  Labs Baby asa daily Lexapro 10 mg qhs for anxiety, rec stop propanalol She does want to see dietician Rec total of 12 pound weight gain   Summer Padilla 08/09/2018

## 2018-08-09 NOTE — Addendum Note (Signed)
Addended by: Kathie Dike on: 08/09/2018 03:44 PM   Modules accepted: Orders

## 2018-08-09 NOTE — Progress Notes (Signed)
Bedside U/S shows single IUP with FHT of 174 BPM and CRL meas 22.64 mm  GA 9w

## 2018-08-09 NOTE — Addendum Note (Signed)
Addended by: Granville Lewis on: 08/09/2018 03:53 PM   Modules accepted: Orders

## 2018-08-10 LAB — OBSTETRIC PANEL
Absolute Monocytes: 399 cells/uL (ref 200–950)
Antibody Screen: NOT DETECTED
Basophils Absolute: 49 cells/uL (ref 0–200)
Basophils Relative: 0.7 %
Eosinophils Absolute: 77 cells/uL (ref 15–500)
Eosinophils Relative: 1.1 %
HCT: 38.2 % (ref 35.0–45.0)
Hemoglobin: 12.4 g/dL (ref 11.7–15.5)
Hepatitis B Surface Ag: NONREACTIVE
Lymphs Abs: 1806 cells/uL (ref 850–3900)
MCH: 26.2 pg — ABNORMAL LOW (ref 27.0–33.0)
MCHC: 32.5 g/dL (ref 32.0–36.0)
MCV: 80.6 fL (ref 80.0–100.0)
MPV: 11.8 fL (ref 7.5–12.5)
Monocytes Relative: 5.7 %
Neutro Abs: 4669 cells/uL (ref 1500–7800)
Neutrophils Relative %: 66.7 %
Platelets: 235 10*3/uL (ref 140–400)
RBC: 4.74 10*6/uL (ref 3.80–5.10)
RDW: 13.1 % (ref 11.0–15.0)
RPR Ser Ql: NONREACTIVE
Rubella: 2.03 index
Total Lymphocyte: 25.8 %
WBC: 7 10*3/uL (ref 3.8–10.8)

## 2018-08-10 LAB — PROTEIN / CREATININE RATIO, URINE
Creatinine, Urine: 196 mg/dL (ref 20–275)
Protein/Creat Ratio: 143 mg/g creat (ref 21–161)
Protein/Creatinine Ratio: 0.143 mg/mg creat (ref 0.021–0.16)
Total Protein, Urine: 28 mg/dL — ABNORMAL HIGH (ref 5–24)

## 2018-08-10 LAB — COMPREHENSIVE METABOLIC PANEL
AG Ratio: 1.3 (calc) (ref 1.0–2.5)
ALT: 10 U/L (ref 6–29)
AST: 14 U/L (ref 10–30)
Albumin: 4 g/dL (ref 3.6–5.1)
Alkaline phosphatase (APISO): 55 U/L (ref 31–125)
BUN: 8 mg/dL (ref 7–25)
CO2: 25 mmol/L (ref 20–32)
Calcium: 9.2 mg/dL (ref 8.6–10.2)
Chloride: 102 mmol/L (ref 98–110)
Creat: 0.5 mg/dL (ref 0.50–1.10)
Globulin: 3.1 g/dL (calc) (ref 1.9–3.7)
Glucose, Bld: 145 mg/dL — ABNORMAL HIGH (ref 65–99)
Potassium: 3.6 mmol/L (ref 3.5–5.3)
Sodium: 135 mmol/L (ref 135–146)
Total Bilirubin: 0.8 mg/dL (ref 0.2–1.2)
Total Protein: 7.1 g/dL (ref 6.1–8.1)

## 2018-08-10 LAB — HEMOGLOBIN A1C
Hgb A1c MFr Bld: 5.7 % of total Hgb — ABNORMAL HIGH (ref <5.7)
Mean Plasma Glucose: 117 (calc)
eAG (mmol/L): 6.5 (calc)

## 2018-08-10 LAB — HIV ANTIBODY (ROUTINE TESTING W REFLEX): HIV 1&2 Ab, 4th Generation: NONREACTIVE

## 2018-08-11 LAB — CYTOLOGY - PAP
Chlamydia: NEGATIVE
Diagnosis: NEGATIVE
HPV: NOT DETECTED
Neisseria Gonorrhea: NEGATIVE

## 2018-08-11 LAB — URINE CULTURE, OB REFLEX

## 2018-08-11 LAB — CULTURE, OB URINE

## 2018-08-12 ENCOUNTER — Ambulatory Visit: Payer: Self-pay | Admitting: Registered"

## 2018-09-03 ENCOUNTER — Telehealth: Payer: Self-pay

## 2018-09-03 ENCOUNTER — Other Ambulatory Visit: Payer: Self-pay | Admitting: Advanced Practice Midwife

## 2018-09-03 DIAGNOSIS — F419 Anxiety disorder, unspecified: Secondary | ICD-10-CM

## 2018-09-03 MED ORDER — HYDROXYZINE PAMOATE 25 MG PO CAPS: 25.0000 mg | ORAL_CAPSULE | Freq: Three times a day (TID) | ORAL | 3 refills | Status: DC | PRN

## 2018-09-03 NOTE — Telephone Encounter (Signed)
Pt called wanting to know if she can take her Xanax while pregnant. I told pt we do not recommend it per Sharen Counter, CNM. Pt expressed understanding. Misty Stanley will send in different anxiety meds to pt's pharmacy. Pt requested it not to be Lexapro.

## 2018-09-03 NOTE — Progress Notes (Unsigned)
Pt called the office with report of anxiety/panic attacks in early pregnancy with hx of these treated by Xanax.  Pt was started on Lexapro at her new OB visit but does not like the side effects of this medication so is not taking it.  Would like something to spot treat anxiety.    Rx for Vistaril 25-50 mg PO TID PRN sent to pt pharmacy.  F/U at next OB visit and with behavioral health PRN.

## 2018-09-08 ENCOUNTER — Telehealth: Payer: Self-pay

## 2018-09-08 NOTE — Telephone Encounter (Signed)
Returning pt call. Pt states she has pain in lower abdomen. Pt denies bleeding, spotting, etc. I explained to pt that this can be round ligament pain. Encourage pt to take Tylenol.. Pt is aware that if pain becomes worse or bleeding occurs that she should go to [facility].

## 2018-09-27 ENCOUNTER — Encounter: Payer: BLUE CROSS/BLUE SHIELD | Admitting: Obstetrics & Gynecology

## 2018-09-29 NOTE — Progress Notes (Signed)
This encounter was created in error - please disregard.

## 2018-10-04 ENCOUNTER — Telehealth: Payer: Self-pay | Admitting: *Deleted

## 2018-10-04 ENCOUNTER — Ambulatory Visit: Payer: BLUE CROSS/BLUE SHIELD | Admitting: Obstetrics & Gynecology

## 2018-10-04 NOTE — Telephone Encounter (Signed)
Left patient a message to call the office to reschedule missed appointment on 10/04/2018 at 9:00am.

## 2018-10-05 NOTE — Progress Notes (Signed)
dnka

## 2018-10-18 ENCOUNTER — Ambulatory Visit (HOSPITAL_COMMUNITY): Payer: BC Managed Care – PPO | Admitting: *Deleted

## 2018-10-18 ENCOUNTER — Encounter (HOSPITAL_COMMUNITY): Payer: Self-pay

## 2018-10-18 ENCOUNTER — Ambulatory Visit (HOSPITAL_COMMUNITY)
Admission: RE | Admit: 2018-10-18 | Discharge: 2018-10-18 | Disposition: A | Discharge status: Home / Self Care | Payer: BC Managed Care – PPO | Source: Ambulatory Visit | Attending: Obstetrics and Gynecology | Admitting: Obstetrics and Gynecology

## 2018-10-18 ENCOUNTER — Other Ambulatory Visit: Payer: Self-pay

## 2018-10-18 ENCOUNTER — Other Ambulatory Visit (HOSPITAL_COMMUNITY): Payer: Self-pay | Admitting: *Deleted

## 2018-10-18 VITALS — BP 131/76 | HR 97 | Temp 98.7°F

## 2018-10-18 DIAGNOSIS — O09299 Supervision of pregnancy with other poor reproductive or obstetric history, unspecified trimester: Secondary | ICD-10-CM

## 2018-10-18 DIAGNOSIS — O09292 Supervision of pregnancy with other poor reproductive or obstetric history, second trimester: Secondary | ICD-10-CM | POA: Diagnosis not present

## 2018-10-18 DIAGNOSIS — O09891 Supervision of other high risk pregnancies, first trimester: Secondary | ICD-10-CM | POA: Diagnosis present

## 2018-10-18 DIAGNOSIS — Z362 Encounter for other antenatal screening follow-up: Secondary | ICD-10-CM

## 2018-10-18 DIAGNOSIS — Z3A19 19 weeks gestation of pregnancy: Secondary | ICD-10-CM

## 2018-10-18 DIAGNOSIS — O34219 Maternal care for unspecified type scar from previous cesarean delivery: Secondary | ICD-10-CM

## 2018-10-18 DIAGNOSIS — O09522 Supervision of elderly multigravida, second trimester: Secondary | ICD-10-CM

## 2018-10-18 DIAGNOSIS — O09291 Supervision of pregnancy with other poor reproductive or obstetric history, first trimester: Secondary | ICD-10-CM | POA: Diagnosis present

## 2018-10-18 DIAGNOSIS — O09212 Supervision of pregnancy with history of pre-term labor, second trimester: Secondary | ICD-10-CM

## 2018-10-18 DIAGNOSIS — Z8632 Personal history of gestational diabetes: Secondary | ICD-10-CM | POA: Diagnosis present

## 2018-10-18 DIAGNOSIS — O09521 Supervision of elderly multigravida, first trimester: Secondary | ICD-10-CM | POA: Diagnosis present

## 2018-10-18 DIAGNOSIS — O09523 Supervision of elderly multigravida, third trimester: Secondary | ICD-10-CM

## 2018-10-25 ENCOUNTER — Ambulatory Visit (INDEPENDENT_AMBULATORY_CARE_PROVIDER_SITE_OTHER): Payer: BC Managed Care – PPO | Admitting: Obstetrics & Gynecology

## 2018-10-25 ENCOUNTER — Other Ambulatory Visit: Payer: Self-pay

## 2018-10-25 DIAGNOSIS — O10919 Unspecified pre-existing hypertension complicating pregnancy, unspecified trimester: Secondary | ICD-10-CM

## 2018-10-25 DIAGNOSIS — I10 Essential (primary) hypertension: Secondary | ICD-10-CM | POA: Insufficient documentation

## 2018-10-25 DIAGNOSIS — O10912 Unspecified pre-existing hypertension complicating pregnancy, second trimester: Secondary | ICD-10-CM

## 2018-10-25 DIAGNOSIS — O99342 Other mental disorders complicating pregnancy, second trimester: Secondary | ICD-10-CM

## 2018-10-25 DIAGNOSIS — Z131 Encounter for screening for diabetes mellitus: Secondary | ICD-10-CM

## 2018-10-25 DIAGNOSIS — O09891 Supervision of other high risk pregnancies, first trimester: Secondary | ICD-10-CM

## 2018-10-25 DIAGNOSIS — Z3A20 20 weeks gestation of pregnancy: Secondary | ICD-10-CM

## 2018-10-25 DIAGNOSIS — O09892 Supervision of other high risk pregnancies, second trimester: Secondary | ICD-10-CM

## 2018-10-25 DIAGNOSIS — F419 Anxiety disorder, unspecified: Secondary | ICD-10-CM | POA: Insufficient documentation

## 2018-10-25 DIAGNOSIS — O119 Pre-existing hypertension with pre-eclampsia, unspecified trimester: Secondary | ICD-10-CM | POA: Insufficient documentation

## 2018-10-25 LAB — GLUCOSE, POCT (MANUAL RESULT ENTRY): POC Glucose: 112 mg/dl — AB (ref 70–99)

## 2018-10-25 NOTE — Patient Instructions (Signed)
Diabetes Mellitus and Exercise Exercising regularly is important for your overall health, especially when you have diabetes (diabetes mellitus). Exercising is not only about losing weight. It has many other health benefits, such as increasing muscle strength and bone density and reducing body fat and stress. This leads to improved fitness, flexibility, and endurance, all of which result in better overall health. Exercise has additional benefits for people with diabetes, including:  Reducing appetite.  Helping to lower and control blood glucose.  Lowering blood pressure.  Helping to control amounts of fatty substances (lipids) in the blood, such as cholesterol and triglycerides.  Helping the body to respond better to insulin (improving insulin sensitivity).  Reducing how much insulin the body needs.  Decreasing the risk for heart disease by: ? Lowering cholesterol and triglyceride levels. ? Increasing the levels of good cholesterol. ? Lowering blood glucose levels. What is my activity plan? Your health care provider or certified diabetes educator can help you make a plan for the type and frequency of exercise (activity plan) that works for you. Make sure that you:  Do at least 150 minutes of moderate-intensity or vigorous-intensity exercise each week. This could be brisk walking, biking, or water aerobics. ? Do stretching and strength exercises, such as yoga or weightlifting, at least 2 times a week. ? Spread out your activity over at least 3 days of the week.  Get some form of physical activity every day. ? Do not go more than 2 days in a row without some kind of physical activity. ? Avoid being inactive for more than 30 minutes at a time. Take frequent breaks to walk or stretch.  Choose a type of exercise or activity that you enjoy, and set realistic goals.  Start slowly, and gradually increase the intensity of your exercise over time. What do I need to know about managing my  diabetes?   Check your blood glucose before and after exercising. ? If your blood glucose is 240 mg/dL (13.3 mmol/L) or higher before you exercise, check your urine for ketones. If you have ketones in your urine, do not exercise until your blood glucose returns to normal. ? If your blood glucose is 100 mg/dL (5.6 mmol/L) or lower, eat a snack containing 15-20 grams of carbohydrate. Check your blood glucose 15 minutes after the snack to make sure that your level is above 100 mg/dL (5.6 mmol/L) before you start your exercise.  Know the symptoms of low blood glucose (hypoglycemia) and how to treat it. Your risk for hypoglycemia increases during and after exercise. Common symptoms of hypoglycemia can include: ? Hunger. ? Anxiety. ? Sweating and feeling clammy. ? Confusion. ? Dizziness or feeling light-headed. ? Increased heart rate or palpitations. ? Blurry vision. ? Tingling or numbness around the mouth, lips, or tongue. ? Tremors or shakes. ? Irritability.  Keep a rapid-acting carbohydrate snack available before, during, and after exercise to help prevent or treat hypoglycemia.  Avoid injecting insulin into areas of the body that are going to be exercised. For example, avoid injecting insulin into: ? The arms, when playing tennis. ? The legs, when jogging.  Keep records of your exercise habits. Doing this can help you and your health care provider adjust your diabetes management plan as needed. Write down: ? Food that you eat before and after you exercise. ? Blood glucose levels before and after you exercise. ? The type and amount of exercise you have done. ? When your insulin is expected to peak, if you use   insulin. Avoid exercising at times when your insulin is peaking.  When you start a new exercise or activity, work with your health care provider to make sure the activity is safe for you, and to adjust your insulin, medicines, or food intake as needed.  Drink plenty of water while  you exercise to prevent dehydration or heat stroke. Drink enough fluid to keep your urine clear or pale yellow. Summary  Exercising regularly is important for your overall health, especially when you have diabetes (diabetes mellitus).  Exercising has many health benefits, such as increasing muscle strength and bone density and reducing body fat and stress.  Your health care provider or certified diabetes educator can help you make a plan for the type and frequency of exercise (activity plan) that works for you.  When you start a new exercise or activity, work with your health care provider to make sure the activity is safe for you, and to adjust your insulin, medicines, or food intake as needed. This information is not intended to replace advice given to you by your health care provider. Make sure you discuss any questions you have with your health care provider. Document Released: 07/05/2003 Document Revised: 11/06/2016 Document Reviewed: 09/24/2015 Elsevier Patient Education  2020 [employer] for Diabetes Mellitus, Adult  Carbohydrate counting is a method of keeping track of how many carbohydrates you eat. Eating carbohydrates naturally increases the amount of sugar (glucose) in the blood. Counting how many carbohydrates you eat helps keep your blood glucose within normal limits, which helps you manage your diabetes (diabetes mellitus). It is important to know how many carbohydrates you can safely have in each meal. This is different for every person. A diet and nutrition specialist (registered dietitian) can help you make a meal plan and calculate how many carbohydrates you should have at each meal and snack. Carbohydrates are found in the following foods:  Grains, such as breads and cereals.  Dried beans and soy products.  Starchy vegetables, such as potatoes, peas, and corn.  Fruit and fruit juices.  Milk and yogurt.  Sweets and snack foods, such as cake,  cookies, candy, chips, and soft drinks. How do I count carbohydrates? There are two ways to count carbohydrates in food. You can use either of the methods or a combination of both. Reading "Nutrition Facts" on packaged food The "Nutrition Facts" list is included on the labels of almost all packaged foods and beverages in the [place] It includes:  The serving size.  Information about nutrients in each serving, including the grams (g) of carbohydrate per serving. To use the "Nutrition Facts":  Decide how many servings you will have.  Multiply the number of servings by the number of carbohydrates per serving.  The resulting number is the total amount of carbohydrates that you will be having. Learning standard serving sizes of other foods When you eat carbohydrate foods that are not packaged or do not include "Nutrition Facts" on the label, you need to measure the servings in order to count the amount of carbohydrates:  Measure the foods that you will eat with a food scale or measuring cup, if needed.  Decide how many standard-size servings you will eat.  Multiply the number of servings by 15. Most carbohydrate-rich foods have about 15 g of carbohydrates per serving. ? For example, if you eat 8 oz (170 g) of strawberries, you will have eaten 2 servings and 30 g of carbohydrates (2 servings x 15 g = 30 g).  For foods that have more than one food mixed, such as soups and casseroles, you must count the carbohydrates in each food that is included. The following list contains standard serving sizes of common carbohydrate-rich foods. Each of these servings has about 15 g of carbohydrates:   hamburger bun or  English muffin.   oz (15 mL) syrup.   oz (14 g) jelly.  1 slice of bread.  1 six-inch tortilla.  3 oz (85 g) cooked rice or pasta.  4 oz (113 g) cooked dried beans.  4 oz (113 g) starchy vegetable, such as peas, corn, or potatoes.  4 oz (113 g) hot cereal.  4 oz (113 g)  mashed potatoes or  of a large baked potato.  4 oz (113 g) canned or frozen fruit.  4 oz (120 mL) fruit juice.  4-6 crackers.  6 chicken nuggets.  6 oz (170 g) unsweetened dry cereal.  6 oz (170 g) plain fat-free yogurt or yogurt sweetened with artificial sweeteners.  8 oz (240 mL) milk.  8 oz (170 g) fresh fruit or one small piece of fruit.  24 oz (680 g) popped popcorn. Example of carbohydrate counting Sample meal  3 oz (85 g) chicken breast.  6 oz (170 g) brown rice.  4 oz (113 g) corn.  8 oz (240 mL) milk.  8 oz (170 g) strawberries with sugar-free whipped topping. Carbohydrate calculation 1. Identify the foods that contain carbohydrates: ? Rice. ? Corn. ? Milk. ? Strawberries. 2. Calculate how many servings you have of each food: ? 2 servings rice. ? 1 serving corn. ? 1 serving milk. ? 1 serving strawberries. 3. Multiply each number of servings by 15 g: ? 2 servings rice x 15 g = 30 g. ? 1 serving corn x 15 g = 15 g. ? 1 serving milk x 15 g = 15 g. ? 1 serving strawberries x 15 g = 15 g. 4. Add together all of the amounts to find the total grams of carbohydrates eaten: ? 30 g + 15 g + 15 g + 15 g = 75 g of carbohydrates total. Summary  Carbohydrate counting is a method of keeping track of how many carbohydrates you eat.  Eating carbohydrates naturally increases the amount of sugar (glucose) in the blood.  Counting how many carbohydrates you eat helps keep your blood glucose within normal limits, which helps you manage your diabetes.  A diet and nutrition specialist (registered dietitian) can help you make a meal plan and calculate how many carbohydrates you should have at each meal and snack. This information is not intended to replace advice given to you by your health care provider. Make sure you discuss any questions you have with your health care provider. Document Released: 04/14/2005 Document Revised: 11/06/2016 Document Reviewed: 09/26/2015  Elsevier Patient Education  2020 [employer].

## 2018-10-25 NOTE — Progress Notes (Signed)
   PRENATAL VISIT NOTE  Subjective:  Summer Padilla is a 38 y.o. [id number] at [redacted]w[redacted]d being seen today for ongoing prenatal care.  She is currently monitored for the following issues for this high-risk pregnancy and has Other chronic cystitis with hematuria; History of cesarean delivery, currently pregnant; Supervision of other high risk pregnancies, first trimester; AMA (advanced maternal age) multigravida 3+; Severe obesity (BMI 35.0-39.9) with comorbidity ([name]); H/O pre-eclampsia in prior pregnancy, currently pregnant, first trimester; and History of gestational diabetes in prior pregnancy, currently pregnant on their problem list.  Patient reports anxiety; pain over abdominoplasty scaraches form visteril.   .  .   . Denies leaking of fluid.   Pt's Hgbh A1c=5.7 at first prenatal.  Pt has history of GDM with second pregnancy and wieghs more than she did with that pregnancy.  Fasting CBG today = 112.  The following portions of the patient's history were reviewed and updated as appropriate: allergies, current medications, past family history, past medical history, past social history, past surgical history and problem list.   Objective:  There were no vitals filed for this visit.  Fetal Status: +FH by RN  General:  Alert, oriented and cooperative. Patient is in no acute distress.  Skin: Skin is warm and dry. No rash noted.   Cardiovascular: Normal heart rate noted  Respiratory: Normal respiratory effort, no problems with respiration noted  Abdomen: Soft, gravid, appropriate for gestational age.        Pelvic: Cervical exam deferred        Extremities: Normal range of motion.     Mental Status: Normal mood and affect. Normal behavior. Normal judgment and thought content.   Assessment and Plan:  Pregnancy: [id number] at [redacted]w[redacted]d 1. Supervision of other high risk pregnancies, first trimester Babyscripts=Pt educated to make sure values cross.  RN to watch. Screening first trimester HgbA1c is  elevated--Fasting elevated today.  Will consider GDM and send to diabetes management for counseling and education Baby asa VBAC Has f/u US with MFM to complete anatomy.  2.  Anxiety--Counseling referral for CBT.    3.  RTC 10 days to review CBGs   Preterm labor symptoms and general obstetric precautions including but not limited to vaginal bleeding, contractions, leaking of fluid and fetal movement were reviewed in detail with the patient. Please refer to After Visit Summary for other counseling recommendations.   No follow-ups on file.  Future Appointments  Date Time Provider [place]  11/15/2018  8:30 AM [place] [facility]  11/15/2018  8:30 AM [employer] [place] [facility]    Silas [place], MD

## 2018-10-25 NOTE — Progress Notes (Signed)
PT c/o headaches after taking Vistaril

## 2018-11-02 ENCOUNTER — Ambulatory Visit (INDEPENDENT_AMBULATORY_CARE_PROVIDER_SITE_OTHER): Payer: BC Managed Care – PPO | Admitting: Certified Nurse Midwife

## 2018-11-02 ENCOUNTER — Other Ambulatory Visit: Payer: Self-pay

## 2018-11-02 VITALS — BP 127/77 | HR 85 | Temp 98.5°F | Wt 226.0 lb

## 2018-11-02 DIAGNOSIS — Z3A21 21 weeks gestation of pregnancy: Secondary | ICD-10-CM

## 2018-11-02 DIAGNOSIS — O26892 Other specified pregnancy related conditions, second trimester: Secondary | ICD-10-CM

## 2018-11-02 DIAGNOSIS — J029 Acute pharyngitis, unspecified: Secondary | ICD-10-CM

## 2018-11-02 DIAGNOSIS — G43009 Migraine without aura, not intractable, without status migrainosus: Secondary | ICD-10-CM

## 2018-11-02 DIAGNOSIS — O98512 Other viral diseases complicating pregnancy, second trimester: Secondary | ICD-10-CM

## 2018-11-02 MED ORDER — BUTALBITAL-APAP-CAFFEINE 50-325-40 MG PO CAPS: 1.0000 | ORAL_CAPSULE | Freq: Four times a day (QID) | ORAL | 3 refills | Status: DC | PRN

## 2018-11-02 MED ORDER — CEPACOL SORE THROAT 5.4 MG MT LOZG: 1.0000 | LOZENGE | Freq: Four times a day (QID) | OROMUCOSAL | 0 refills | Status: DC | PRN

## 2018-11-02 NOTE — Progress Notes (Signed)
Headaches since Friday without relief and sore throat.  Has been gargling with salt water and having nasal drainage

## 2018-11-02 NOTE — Progress Notes (Signed)
PRENATAL VISIT NOTE  Subjective:  Summer Padilla is a 38 y.o. N2T5573 at [redacted]w[redacted]d being seen today for a problem visit.  She is currently monitored for the following issues for this high-risk pregnancy and has Other chronic cystitis with hematuria; History of cesarean delivery, currently pregnant; Supervision of other high risk pregnancies, first trimester; AMA (advanced maternal age) multigravida 74+; Severe obesity (BMI 35.0-39.9) with comorbidity ([place]); H/O pre-eclampsia in prior pregnancy, currently pregnant, first trimester; History of gestational diabetes in prior pregnancy, currently pregnant; Chronic hypertension during pregnancy, antepartum; and Anxiety on their problem list.  Patient reports headache, sore throat, and runny nose since Friday .  Contractions: Not present. Vag. Bleeding: None.  Movement: Present. Denies leaking of fluid.   The following portions of the patient's history were reviewed and updated as appropriate: allergies, current medications, past family history, past medical history, past social history, past surgical history and problem list.   Objective:   Vitals:   11/02/18 0845  BP: 127/77  Pulse: 85  Temp: 98.5 F (36.9 C)  Weight: 226 lb (102.5 kg)    Fetal Status: Fetal Heart Rate (bpm): 140   Movement: Present     General:  Alert, oriented and cooperative. Patient is in no acute distress.  Skin: Skin is warm and dry. No rash noted.   Cardiovascular: Normal heart rate noted  Respiratory: Normal respiratory effort, no problems with respiration noted  Abdomen: Soft, gravid, appropriate for gestational age.  Pain/Pressure: Absent     Pelvic: Cervical exam deferred        Extremities: Normal range of motion.  Edema: Trace  Mental Status: Normal mood and affect. Normal behavior. Normal judgment and thought content.   Assessment and Plan:  Pregnancy: U2G2542 at [redacted]w[redacted]d 1. Migraine without aura and without status migrainosus, not intractable - Patient  reports symptoms have been occurring since Friday, has taken Tylenol without relief of HA.  - Has hx of migraines which she has never been on medication for  - Denies vision changes, RUQ pain or hx of hypertension  - BP normal today  - Butalbital-APAP-Caffeine 50-325-40 MG capsule; Take 1-2 capsules by mouth every 6 (six) hours as needed for headache.  Dispense: 30 capsule; Refill: 3  2. Acute sore throat - Symptoms of sore throat and runny nose started occurring on Friday with HA  - Denies cough, chills, fever, or being around any one sick  - Has been using salt water gargles with no relief  - Strep swab collected, educated and discussed COVID19 precautions  - Encouraged patient to self quarantine at home for at least 14 days from the start of symptoms in case worsening symptoms or COVID  - Possible COVID testing dependent on results of strep  - Menthol (CEPACOL SORE THROAT) 5.4 MG LOZG; Use as directed 1 lozenge (5.4 mg total) in the mouth or throat every 6 (six) hours as needed.  Dispense: 30 lozenge; Refill: 0 - Culture, Group A Strep  Preterm labor symptoms and general obstetric precautions including but not limited to vaginal bleeding, contractions, leaking of fluid and fetal movement were reviewed in detail with the patient. Please refer to After Visit Summary for other counseling recommendations.   Future Appointments  Date Time Provider [place]  11/15/2018  8:30 AM [place] [facility]  11/15/2018  8:30 AM [facility] [place] 1 [facility]  11/15/2018  3:00 PM Lillie Fragmin T, LCSW [facility] None  11/17/2018  2:00 PM [place]  11/22/2018 10:30 AM  Lesly DukesLeggett, Kelly H, MD [facility]  12/20/2018  8:30 AM Allie Bossierove, Myra C, MD [facility]    Sharyon CableVeronica C Marchella Hibbard, CNM

## 2018-11-02 NOTE — Patient Instructions (Signed)
Sore Throat When you have a sore throat, your throat may feel:  Tender.  Burning.  Irritated.  Scratchy.  Painful when you swallow.  Painful when you talk. Many things can cause a sore throat, such as:  An infection.  Allergies.  Dry air.  Smoke or pollution.  Radiation treatment.  Gastroesophageal reflux disease (GERD).  A tumor. A sore throat can be the first sign of another sickness. It can happen with other problems, like:  Coughing.  Sneezing.  Fever.  Swelling in the neck. Most sore throats go away without treatment. Follow these instructions at home:      Take over-the-counter medicines only as told by your doctor. ? If your child has a sore throat, do not give your child aspirin.  Drink enough fluids to keep your pee (urine) pale yellow.  Rest when you feel you need to.  To help with pain: ? Sip warm liquids, such as broth, herbal tea, or warm water. ? Eat or drink cold or frozen liquids, such as frozen ice pops. ? Gargle with a salt-water mixture 3-4 times a day or as needed. To make a salt-water mixture, add -1 tsp (3-6 g) of salt to 1 cup (237 mL) of warm water. Mix it until you cannot see the salt anymore. ? Suck on hard candy or throat lozenges. ? Put a cool-mist humidifier in your bedroom at night. ? Sit in the bathroom with the door closed for 5-10 minutes while you run hot water in the shower.  Do not use any products that contain nicotine or tobacco, such as cigarettes, e-cigarettes, and chewing tobacco. If you need help quitting, ask your doctor.  Wash your hands well and often with soap and water. If soap and water are not available, use hand sanitizer. Contact a doctor if:  You have a fever for more than 2-3 days.  You keep having symptoms for more than 2-3 days.  Your throat does not get better in 7 days.  You have a fever and your symptoms suddenly get worse.  Your child who is 3 months to 27 years old has a temperature of  102.37F (39C) or higher. Get help right away if:  You have trouble breathing.  You cannot swallow fluids, soft foods, or your saliva.  You have swelling in your throat or neck that gets worse.  You keep feeling sick to your stomach (nauseous).  You keep throwing up (vomiting). Summary  A sore throat is pain, burning, irritation, or scratchiness in the throat. Many things can cause a sore throat.  Take over-the-counter medicines only as told by your doctor. Do not give your child aspirin.  Drink plenty of fluids, and rest as needed.  Contact a doctor if your symptoms get worse or your sore throat does not get better within 7 days. This information is not intended to replace advice given to you by your health care provider. Make sure you discuss any questions you have with your health care provider. Document Released: 01/22/2008 Document Revised: 09/14/2017 Document Reviewed: 09/14/2017 Elsevier Patient Education  2020 [employer].  Pregnancy and COVID-19 Coronavirus disease, also called COVID-19, is an infection of the lungs and airways (respiratory tract). It is unclear at this time if pregnancy makes it more likely for you to get COVID-19, or what effects the infection may have on your unborn baby. However, pregnancy causes changes to your heart, lungs, and the body's disease-fighting system (immune system). Some of these changes make it more likely  for you to get sick and have more serious illness. Therefore, it is important for you to take precautions in order to protect yourself and your unborn baby. Work with your health care team to develop a plan to protect yourself from all infections, including COVID-19. This is one way for you to stay healthy during your pregnancy and to keep your baby healthy as well. How does this affect me? If you get COVID-19, there is a risk that you may:  Get a respiratory illness that can lead to pneumonia.  Give birth to your baby before 37 weeks  of pregnancy (premature birth). If you have or may have COVID-19, your health care provider may recommend special precautions around your pregnancy. This may affect how you:  Receive care before delivery (prenatal care). How you visit your health care provider may change. Tests and scans may need to be performed differently.  Receive care during labor and delivery. This may affect your birth plan, including who may be with you during labor and delivery.  Receive care after you deliver your baby (postpartum care). You may stay longer in the hospital, and in a special room. Your baby may also need to stay away from you.  Feed your baby after he or she is born. Pregnancy can be an especially stressful time because of the changes in your body and the preparation involved in becoming a parent. In addition, you may be feeling especially fearful, anxious, or stressed because of COVID-19 and how it is affecting you. How does this affect my baby? It is not known whether a mother will transmit the virus to her unborn baby. There is a risk that if you get COVID-19:  The virus that causes COVID-19 can pass to your baby.  You may have premature birth. Your baby may require more medical care if this happens. What can I do to lower my risk?  There is no vaccine to help prevent COVID-19. However, there are actions that you can take to protect yourself and others from this virus. Cleaning and personal hygiene  Wash your hands often with soap and water for at least 20 seconds. If soap and water are not available, use alcohol-based hand sanitizer.  Avoid touching your mouth, face, eyes, or nose.  Clean and disinfect objects and surfaces that are frequently touched every day. This may include: ? Counters and tables. ? Doorknobs and light switches. ? Sinks and faucets. ? Electronics such as phones, remote controls, keyboards, computers, and tablets. Stay away from others  Stay away from people who are  sick, if possible.  Avoid social gatherings and travel.  Stay home as much as possible. Follow these instructions: Breastfeeding It is not known if the virus that causes COVID-19 can pass through breast milk to your baby. You should make a plan for feeding your infant with your family and your health care team. If you have or may have COVID-19, your health care provider may recommend that you take precautions while breastfeeding, such as:  Washing your hands before feeding your baby.  Wearing a mask while feeding your baby.  Pumping or expressing breast milk to feed to your baby. If possible, ask someone in your household who is not sick to feed your baby the expressed breast milk. ? Wash your hands before touching pump parts. ? Wash and disinfect all pump parts after expressing milk. Follow the manufacturer's instructions to clean and disinfect all pump parts. General instructions  If you think you have  a COVID-19 infection, contact your health care provider right away. Tell your health care provider that you think you may have a COVID-19 infection.  Follow your health care provider's instructions on taking medicines. Some medicines may be unsafe to take during pregnancy.  Cover your mouth and nose by wearing a mask or other cloth covering over your face when you go out in public.  Find ways to manage stress. These may include: ? Using relaxation techniques like meditation and deep breathing. ? Getting regular exercise. Most women can continue their usual exercise routine during pregnancy. Ask your health care provider what activities are safe for you. ? Seeking support from family, friends, or spiritual resources. If you cannot be together in person, you can still connect by phone calls, texts, video calls, or online messaging. ? Spending time doing relaxing activities that you enjoy, like listening to music or reading a good book.  Ask for help if you have counseling or nutritional  needs during pregnancy. Your health care provider can offer advice or refer you to resources or specialists who can help you with various needs.  Keep all follow-up visits as told by your health care provider. This is important. Where to find more information Centers for Disease Control and Prevention (CDC): AffordableShare.com.brwww.cdc.gov/coronavirus/2019-ncov/ World Health Organization [facility](WHO): PokerPortraits.eswww.who.int/news-room/q-a-detail/q-a-on-covid-19-pregnancy-childbirth-and-breastfeeding [employer] (ACOG): BuyDucts.dkwww.acog.org/patient-resources/faqs/pregnancy/coronavirus-pregnancy-and-breastfeeding Questions to ask your health care team  What should I do if I have COVID-19 symptoms?  How will COVID-19 affect my prenatal care visits, tests and scans, labor and delivery, and postpartum care?  Should I plan to breastfeed my baby?  Where can I find mental health resources?  Where can I find support if I have financial concerns? Contact a health care provider if:  You have signs and symptoms of infection, including a fever or cough. Tell your health care team that you think you may have a COVID-19 infection.  You have strong emotions, such as sadness or anxiety.  You feel unsafe in your home and need help finding a safe place to live.  You have bloody or watery vaginal discharge or vaginal bleeding. Get help right away if:  You have signs or symptoms of labor before 37 weeks of pregnancy. These include: ? Contractions that are 5 minutes or less apart, or that increase in frequency, intensity, or length. ? Sudden, sharp pain in the abdomen or in the lower back. ? A gush or trickle of fluid from your vagina.  You have difficulty breathing.  You have chest pain. These symptoms may represent a serious problem that is an emergency. Do not wait to see if the symptoms will go away. Get medical help right away. Call your local emergency services (911 in the [place] Do not drive yourself  to the hospital. Summary  Coronavirus disease, also called COVID-19, is an infection of the lungs and airways (respiratory tract). It is unclear at this time if pregnancy makes you more susceptible to COVID-19 and what effects it may have on unborn babies.  It is important to take precautions to protect yourself and your developing baby. This includes washing your hands often, avoiding touching your mouth, face, eyes, or nose, avoiding social gatherings and travel, and staying away from people who are sick.  If you think you have a COVID-19 infection, contact your health care provider right away. Tell your health care provider that you think you may have a COVID-19 infection.  If you have or may have COVID-19, your health care provider  may recommend special precautions during your pregnancy, labor and delivery, and after your baby is born. This information is not intended to replace advice given to you by your health care provider. Make sure you discuss any questions you have with your health care provider. Document Released: 08/10/2018 Document Revised: 08/10/2018 Document Reviewed: 08/10/2018 Elsevier Patient Education  2020 [employer]

## 2018-11-04 ENCOUNTER — Telehealth: Payer: Self-pay

## 2018-11-04 LAB — CULTURE, GROUP A STREP
MICRO NUMBER:: 643827
SPECIMEN QUALITY:: ADEQUATE

## 2018-11-04 NOTE — Telephone Encounter (Addendum)
Spoke with pt and she is aware that she needs to go be tested for COVID. Pt states she is still having the same symptoms and will go be tested tomorrow 11/05/18. PT is also aware to call our office with results and let us know if it is negative or positive. Address and phone number for the location given verbally to pt and also sent through [place] message.   ----- Message from Lajean Manes, CNM sent at 11/04/2018 12:26 PM EDT ----- Patient most likely needs to be screened for COVID if symptoms continue. The closest location would be in [place] at the [facility]   Address is [address]  She does not need an order can just go for their drive through testing Monday, Wednesday or Fridays. So she can go tomorrow if she wanted to. They will call her with results.

## 2018-11-08 ENCOUNTER — Encounter: Payer: BC Managed Care – PPO | Admitting: Obstetrics & Gynecology

## 2018-11-08 ENCOUNTER — Telehealth: Payer: Self-pay

## 2018-11-08 ENCOUNTER — Encounter: Payer: Self-pay | Admitting: Family Medicine

## 2018-11-08 NOTE — Telephone Encounter (Signed)
Spoke with pt to see if her results from COVID testing are back. PT states she will not have results for 7-10 days. Pt states she is still having symptoms. Pt advised to call us when she receives results.

## 2018-11-15 ENCOUNTER — Ambulatory Visit (HOSPITAL_COMMUNITY): Payer: BC Managed Care – PPO | Admitting: Licensed Clinical Social Worker

## 2018-11-15 ENCOUNTER — Other Ambulatory Visit (HOSPITAL_COMMUNITY): Payer: Self-pay | Admitting: *Deleted

## 2018-11-15 ENCOUNTER — Encounter (HOSPITAL_COMMUNITY): Payer: Self-pay

## 2018-11-15 ENCOUNTER — Ambulatory Visit (HOSPITAL_COMMUNITY)
Admission: RE | Admit: 2018-11-15 | Discharge: 2018-11-15 | Disposition: A | Discharge status: Home / Self Care | Payer: BC Managed Care – PPO | Source: Ambulatory Visit | Attending: Obstetrics and Gynecology | Admitting: Obstetrics and Gynecology

## 2018-11-15 ENCOUNTER — Other Ambulatory Visit: Payer: Self-pay

## 2018-11-15 ENCOUNTER — Ambulatory Visit (HOSPITAL_COMMUNITY): Payer: BC Managed Care – PPO | Admitting: *Deleted

## 2018-11-15 VITALS — BP 110/79 | HR 91 | Temp 98.6°F

## 2018-11-15 DIAGNOSIS — O09523 Supervision of elderly multigravida, third trimester: Secondary | ICD-10-CM | POA: Diagnosis present

## 2018-11-15 DIAGNOSIS — O0990 Supervision of high risk pregnancy, unspecified, unspecified trimester: Secondary | ICD-10-CM | POA: Diagnosis present

## 2018-11-15 DIAGNOSIS — O09292 Supervision of pregnancy with other poor reproductive or obstetric history, second trimester: Secondary | ICD-10-CM

## 2018-11-15 DIAGNOSIS — Z3A23 23 weeks gestation of pregnancy: Secondary | ICD-10-CM

## 2018-11-15 DIAGNOSIS — Z362 Encounter for other antenatal screening follow-up: Secondary | ICD-10-CM | POA: Diagnosis present

## 2018-11-15 DIAGNOSIS — O09522 Supervision of elderly multigravida, second trimester: Secondary | ICD-10-CM | POA: Diagnosis not present

## 2018-11-15 DIAGNOSIS — O24410 Gestational diabetes mellitus in pregnancy, diet controlled: Secondary | ICD-10-CM

## 2018-11-15 DIAGNOSIS — O34219 Maternal care for unspecified type scar from previous cesarean delivery: Secondary | ICD-10-CM

## 2018-11-15 DIAGNOSIS — O09212 Supervision of pregnancy with history of pre-term labor, second trimester: Secondary | ICD-10-CM

## 2018-11-16 ENCOUNTER — Telehealth: Payer: Self-pay

## 2018-11-16 NOTE — Telephone Encounter (Signed)
Called pt to find out results from covid test. Pt did not answer and VM full.

## 2018-11-17 ENCOUNTER — Other Ambulatory Visit: Payer: Self-pay

## 2018-11-17 ENCOUNTER — Encounter: Payer: BC Managed Care – PPO | Attending: Obstetrics & Gynecology | Admitting: *Deleted

## 2018-11-17 ENCOUNTER — Telehealth: Payer: BC Managed Care – PPO | Admitting: *Deleted

## 2018-11-17 DIAGNOSIS — Z713 Dietary counseling and surveillance: Secondary | ICD-10-CM | POA: Insufficient documentation

## 2018-11-17 DIAGNOSIS — O09521 Supervision of elderly multigravida, first trimester: Secondary | ICD-10-CM | POA: Diagnosis not present

## 2018-11-17 DIAGNOSIS — O09891 Supervision of other high risk pregnancies, first trimester: Secondary | ICD-10-CM | POA: Diagnosis not present

## 2018-11-17 DIAGNOSIS — Z3A00 Weeks of gestation of pregnancy not specified: Secondary | ICD-10-CM | POA: Diagnosis not present

## 2018-11-17 DIAGNOSIS — O24410 Gestational diabetes mellitus in pregnancy, diet controlled: Secondary | ICD-10-CM

## 2018-11-17 MED ORDER — GLUCOSE BLOOD VI STRP: ORAL_STRIP | 9 refills | Status: DC

## 2018-11-17 MED ORDER — MICROLET LANCETS MISC: 1.0000 | Freq: Four times a day (QID) | 9 refills | Status: DC

## 2018-11-17 MED ORDER — CONTOUR NEXT MONITOR W/DEVICE KIT: 1.0000 | PACK | Freq: Once | 0 refills | Status: AC

## 2018-11-17 NOTE — Progress Notes (Signed)
This visit was completed via telephone due to the COVID-19 pandemic.   I spoke with Summer Padilla and verified that I was speaking with the correct person with two patient identifiers (full name and date of birth).   I discussed the limitations related to this kind of visit and the patient is willing to proceed.  Patient was called on 11/17/2018 for Gestational Diabetes self-management. EDD 03/14/2019. Patient states history of GDM with her 2nd child 6 years ago, but not since then. . Diet history obtained. Patient eats good variety of all food groups. Beverages include water.  She states she works from home so is sitting most of the day. The following learning objectives were met by the patient :   States the definition of Gestational Diabetes  States why dietary management is important in controlling blood glucose  Describes the effects of carbohydrates on blood glucose levels  Demonstrates ability to create a balanced meal plan  Demonstrates carbohydrate counting   States when to check blood glucose levels  Demonstrates proper blood glucose monitoring techniques  States the effect of stress and exercise on blood glucose levels  States the importance of limiting caffeine and abstaining from alcohol and smoking  Plan:  Aim for 3 Carb Choices per meal (45 grams) +/- 1 either way  Aim for 1-2 Carbs per snack Begin reading food labels for Total Carbohydrate of foods If OK with your MD, consider  increasing your activity level by walking, Arm Chair Exercises or other activity daily as tolerated Begin checking BG before breakfast and 2 hours after first bite of breakfast, lunch and dinner as directed by MD  Bring Log Book/Sheet and meter to every medical appointment OR use Baby Scripts (see below) Baby Scripts:  Patient was introduced to [employer] and plans to use as record of BG electronically Take medication if directed by MD  Blood glucose monitor Rx called into pharmacy:  Contour Next with strips and Microlet lancets Patient instructed to test pre breakfast and 2 hours each meal as directed by MD  Patient instructed to monitor glucose levels: FBS: 60 - 95 mg/dl 2 hour: <120 mg/dl  Patient received the following handouts: emailed to patient and reviewed during phone call:  Nutrition Diabetes and Pregnancy  Carbohydrate Counting List  Patient will be seen for follow-up as needed.

## 2018-11-22 ENCOUNTER — Encounter: Payer: BC Managed Care – PPO | Admitting: Obstetrics & Gynecology

## 2018-11-22 ENCOUNTER — Telehealth: Payer: Self-pay | Admitting: *Deleted

## 2018-11-22 ENCOUNTER — Encounter: Payer: Self-pay | Admitting: Obstetrics & Gynecology

## 2018-11-22 NOTE — Telephone Encounter (Signed)
Patient thought appointment was in person instead of MyChart. Patient also was asked the results of her COVID-19 test, stated negative as of 11/17/2018.

## 2018-11-22 NOTE — Progress Notes (Signed)
Weight BP

## 2018-11-23 NOTE — Progress Notes (Signed)
This encounter was created in error - please disregard.

## 2018-11-30 ENCOUNTER — Ambulatory Visit (INDEPENDENT_AMBULATORY_CARE_PROVIDER_SITE_OTHER): Payer: BC Managed Care – PPO | Admitting: Advanced Practice Midwife

## 2018-11-30 ENCOUNTER — Other Ambulatory Visit: Payer: Self-pay

## 2018-11-30 VITALS — BP 125/74 | HR 84 | Wt 227.0 lb

## 2018-11-30 DIAGNOSIS — R519 Headache, unspecified: Secondary | ICD-10-CM

## 2018-11-30 DIAGNOSIS — M545 Low back pain, unspecified: Secondary | ICD-10-CM

## 2018-11-30 DIAGNOSIS — O2342 Unspecified infection of urinary tract in pregnancy, second trimester: Secondary | ICD-10-CM

## 2018-11-30 DIAGNOSIS — Z3A25 25 weeks gestation of pregnancy: Secondary | ICD-10-CM

## 2018-11-30 DIAGNOSIS — O09891 Supervision of other high risk pregnancies, first trimester: Secondary | ICD-10-CM

## 2018-11-30 DIAGNOSIS — O9989 Other specified diseases and conditions complicating pregnancy, childbirth and the puerperium: Secondary | ICD-10-CM

## 2018-11-30 DIAGNOSIS — O26892 Other specified pregnancy related conditions, second trimester: Secondary | ICD-10-CM

## 2018-11-30 DIAGNOSIS — M549 Dorsalgia, unspecified: Secondary | ICD-10-CM

## 2018-11-30 DIAGNOSIS — N3001 Acute cystitis with hematuria: Secondary | ICD-10-CM

## 2018-11-30 DIAGNOSIS — R51 Headache: Secondary | ICD-10-CM

## 2018-11-30 DIAGNOSIS — O99891 Other specified diseases and conditions complicating pregnancy: Secondary | ICD-10-CM

## 2018-11-30 DIAGNOSIS — O368120 Decreased fetal movements, second trimester, not applicable or unspecified: Secondary | ICD-10-CM

## 2018-11-30 LAB — POCT URINALYSIS DIPSTICK
Appearance: ABNORMAL
Bilirubin, UA: NEGATIVE
Glucose, UA: NEGATIVE
Nitrite, UA: NEGATIVE
Protein, UA: NEGATIVE
Spec Grav, UA: >=1.03 — AB (ref 1.010–1.025)
Urobilinogen, UA: NEGATIVE E.U./dL — AB
pH, UA: 6.5 (ref 5.0–8.0)

## 2018-11-30 MED ORDER — BUTALBITAL-APAP-CAFFEINE 50-325-40 MG PO CAPS: 1.0000 | ORAL_CAPSULE | Freq: Four times a day (QID) | ORAL | 3 refills | Status: DC | PRN

## 2018-11-30 MED ORDER — CEPHALEXIN 500 MG PO CAPS: 500.0000 mg | ORAL_CAPSULE | Freq: Four times a day (QID) | ORAL | 0 refills | Status: DC

## 2018-11-30 MED ORDER — MISC. DEVICES MISC: 0 refills | Status: DC

## 2018-11-30 NOTE — Progress Notes (Signed)
Lower back pain and pelvic discomfort.  Large Blood in urine and sent for culture

## 2018-11-30 NOTE — Progress Notes (Addendum)
PRENATAL VISIT NOTE  Subjective:  Summer Padilla is a 38 y.o. [id number] at 4732w1d being seen today for ongoing prenatal care. She is currently monitored for the following issues for this high-risk pregnancy and has Other chronic cystitis with hematuria; History of cesarean delivery, currently pregnant; Supervision of other high risk pregnancies, first trimester; AMA (advanced maternal age) multigravida 35+; Severe obesity (BMI 35.0-39.9) with comorbidity (HCC); H/O pre-eclampsia in prior pregnancy, currently pregnant, first trimester; History of gestational diabetes in prior pregnancy, currently pregnant; Chronic hypertension during pregnancy, antepartum; and Anxiety on their problem list.  Patient reports lower abdominal cramping and pelvic pain for 3 days as well as lower back pain. Walking improves pain. Takes Tylenol PRN but has not helped. She has some burning with urination and malodorous urine. No reports of blood in urine or fever. Pt denies vaginal discharge, leaking of fluid or vaginal bleeding. Pt reports that she has decreased fetal movement for the last week. Reports she has not felt baby move since 4am despite eating and drinking fluids.   The following portions of the patient's history were reviewed and updated as appropriate: allergies, current medications, past family history, past medical history, past social history, past surgical history and problem list.   Objective:   Vitals:   11/30/18 1429  BP: 125/74  Pulse: 84  Weight: 103 kg    Fetal Status: Fetal Heart Rate (bpm): 154   Movement: Present     General:  Alert, oriented and cooperative. Patient is in no acute distress.  Skin: Skin is warm and dry. No rash noted.   Cardiovascular: Normal heart rate noted  Respiratory: Normal respiratory effort, no problems with respiration noted  Abdomen: Soft, gravid, appropriate for gestational age.  Pain/Pressure: Present     Pelvic: Cervical exam performed        Extremities:  Normal range of motion.  Edema: Trace  Mental Status: Normal mood and affect. Normal behavior. Normal judgment and thought content.   Assessment and Plan:  Pregnancy: [id number] at 7732w1d  1. Low back pain, unspecified back pain laterality, unspecified chronicity -- Cervix closed/thick/posterior  2. UTI (urinary tract infection) during pregnancy, second trimester -- POCT Urinalysis Dipstick -- Culture, OB Urine -- Keflex 500 mg QID x 7 days prescribed  3. Hematuria due to acute cystitis  4. Supervision of other high risk pregnancies, second trimester -- BP WNL -- Pt reports CBG of 113 -- Pt to record blood sugars in babyscripts or bring log to next visit  5. Decreased fetal movement affecting management of pregnancy in second trimester, single or unspecified fetus -- NST performed; NST appropriate for gestational age -- Baseline 150, moderate variability, 10x10 accelerations, no decelerations.  -- No contractions   6. Headache in pregnancy, antepartum, second trimester --Chronic h/a prior to pregnancy - Ambulatory referral to Neurology --Renew Fioricet Rx - Butalbital-APAP-Caffeine 50-325-40 MG capsule; Take 1-2 capsules by mouth every 6 (six) hours as needed for headache.  Dispense: 30 capsule; Refill: 3     Preterm labor symptoms and general obstetric precautions including but not limited to vaginal bleeding, contractions, leaking of fluid and fetal movement were reviewed in detail with the patient. Please refer to After Visit Summary for other counseling recommendations.   No follow-ups on file.  Future Appointments  Date Time Provider Department Center  12/13/2018 12:40 PM [facility] NURSE [facility]  12/13/2018 12:45 PM [facility] US 2 [facility] M[facility]-US  12/20/2018  8:30 AM Allie Bossierove, Myra C, MD [facility]  Renee Harder, Student-MidWife   CNM attestation:  I have seen and examined this patient; I agree with above documentation in the midwife student's note.    Summer Padilla is a 38 y.o. [id number] in the [name] for  prenatal visit for high risk pregnancy with c/o intermittent abdominal cramping and constant pelvic pressure x 3 days. See problem list below. +FM, denies LOF, VB, vaginal discharge.  Patient Active Problem List   Diagnosis Date Noted  . Chronic hypertension during pregnancy, antepartum 10/25/2018  . Anxiety 10/25/2018  . AMA (advanced maternal age) multigravida 35+ 08/09/2018  . H/O pre-eclampsia in prior pregnancy, currently pregnant, first trimester 08/09/2018  . History of gestational diabetes in prior pregnancy, currently pregnant 08/09/2018  . Severe obesity (BMI 35.0-39.9) with comorbidity ([place]) 12/30/2017  . Supervision of other high risk pregnancies, first trimester 08/03/2012  . History of cesarean delivery, currently pregnant 07/27/2012  . Other chronic cystitis with hematuria 07/20/2012     ROS, labs, PMH reviewed  PE: BP 125/74   Pulse 84   Wt 227 lb (103 kg)   LMP 05/15/2018   BMI 38.96 kg/m  Gen: calm comfortable, well appearing Resp: normal effort, no distress Abd: gravid appropriate for gestational age  Fundal height: FHT by doppler:  Plan: - fetal kick counts reinforced, Preterm labor precautions -    1. Low back pain, unspecified back pain laterality, unspecified chronicity, unspecified whether sciatica present  - POCT Urinalysis Dipstick - Culture, OB Urine  2. UTI (urinary tract infection) during pregnancy, second trimester   3. Hematuria due to acute cystitis   4. Supervision of other high risk pregnancies, first trimester   5. Decreased fetal movement affecting management of pregnancy in second trimester, single or unspecified fetus --NST appropriate for gestational age  25. Back pain affecting pregnancy in second trimester --No evidence of PTL with closed cervix  7. Headache in pregnancy, antepartum, second trimester  - Ambulatory referral to Neurology -  Butalbital-APAP-Caffeine 50-325-40 MG capsule; Take 1-2 capsules by mouth every 6 (six) hours as needed for headache.  Dispense: 30 capsule; Refill: 3    [name], CNM 3:58 PM

## 2018-12-02 LAB — CULTURE, OB URINE

## 2018-12-02 LAB — URINE CULTURE, OB REFLEX

## 2018-12-13 ENCOUNTER — Encounter (HOSPITAL_COMMUNITY): Payer: Self-pay

## 2018-12-13 ENCOUNTER — Other Ambulatory Visit (HOSPITAL_COMMUNITY): Payer: Self-pay | Admitting: *Deleted

## 2018-12-13 ENCOUNTER — Ambulatory Visit (HOSPITAL_COMMUNITY)
Admission: RE | Admit: 2018-12-13 | Discharge: 2018-12-13 | Disposition: A | Discharge status: Home / Self Care | Payer: BC Managed Care – PPO | Source: Ambulatory Visit | Attending: Obstetrics and Gynecology | Admitting: Obstetrics and Gynecology

## 2018-12-13 ENCOUNTER — Ambulatory Visit (HOSPITAL_COMMUNITY): Payer: BC Managed Care – PPO | Admitting: *Deleted

## 2018-12-13 ENCOUNTER — Other Ambulatory Visit: Payer: Self-pay

## 2018-12-13 VITALS — BP 122/67 | HR 101 | Temp 98.7°F

## 2018-12-13 DIAGNOSIS — O09522 Supervision of elderly multigravida, second trimester: Secondary | ICD-10-CM

## 2018-12-13 DIAGNOSIS — Z362 Encounter for other antenatal screening follow-up: Secondary | ICD-10-CM

## 2018-12-13 DIAGNOSIS — O09292 Supervision of pregnancy with other poor reproductive or obstetric history, second trimester: Secondary | ICD-10-CM

## 2018-12-13 DIAGNOSIS — O24410 Gestational diabetes mellitus in pregnancy, diet controlled: Secondary | ICD-10-CM

## 2018-12-13 DIAGNOSIS — O34219 Maternal care for unspecified type scar from previous cesarean delivery: Secondary | ICD-10-CM

## 2018-12-13 DIAGNOSIS — O09212 Supervision of pregnancy with history of pre-term labor, second trimester: Secondary | ICD-10-CM

## 2018-12-13 DIAGNOSIS — O09529 Supervision of elderly multigravida, unspecified trimester: Secondary | ICD-10-CM

## 2018-12-13 DIAGNOSIS — Z3A27 27 weeks gestation of pregnancy: Secondary | ICD-10-CM

## 2018-12-20 ENCOUNTER — Ambulatory Visit (INDEPENDENT_AMBULATORY_CARE_PROVIDER_SITE_OTHER): Payer: BC Managed Care – PPO | Admitting: Obstetrics & Gynecology

## 2018-12-20 ENCOUNTER — Other Ambulatory Visit: Payer: Self-pay

## 2018-12-20 VITALS — BP 131/80 | HR 92 | Wt 227.0 lb

## 2018-12-20 DIAGNOSIS — Z23 Encounter for immunization: Secondary | ICD-10-CM

## 2018-12-20 DIAGNOSIS — O10913 Unspecified pre-existing hypertension complicating pregnancy, third trimester: Secondary | ICD-10-CM

## 2018-12-20 DIAGNOSIS — O09291 Supervision of pregnancy with other poor reproductive or obstetric history, first trimester: Secondary | ICD-10-CM

## 2018-12-20 DIAGNOSIS — O09891 Supervision of other high risk pregnancies, first trimester: Secondary | ICD-10-CM

## 2018-12-20 DIAGNOSIS — O09523 Supervision of elderly multigravida, third trimester: Secondary | ICD-10-CM

## 2018-12-20 DIAGNOSIS — O99213 Obesity complicating pregnancy, third trimester: Secondary | ICD-10-CM

## 2018-12-20 DIAGNOSIS — Z3A28 28 weeks gestation of pregnancy: Secondary | ICD-10-CM

## 2018-12-20 DIAGNOSIS — Z8632 Personal history of gestational diabetes: Secondary | ICD-10-CM

## 2018-12-20 DIAGNOSIS — O09299 Supervision of pregnancy with other poor reproductive or obstetric history, unspecified trimester: Secondary | ICD-10-CM

## 2018-12-20 DIAGNOSIS — O10919 Unspecified pre-existing hypertension complicating pregnancy, unspecified trimester: Secondary | ICD-10-CM

## 2018-12-20 DIAGNOSIS — O09893 Supervision of other high risk pregnancies, third trimester: Secondary | ICD-10-CM

## 2018-12-20 DIAGNOSIS — O34219 Maternal care for unspecified type scar from previous cesarean delivery: Secondary | ICD-10-CM

## 2018-12-20 DIAGNOSIS — O09293 Supervision of pregnancy with other poor reproductive or obstetric history, third trimester: Secondary | ICD-10-CM

## 2018-12-20 NOTE — Progress Notes (Signed)
   PRENATAL VISIT NOTE  Subjective:  Summer Padilla is a 37 y.o. E3X5400 at [redacted]w[redacted]d being seen today for ongoing prenatal care.  She is currently monitored for the following issues for this high-risk pregnancy and has Other chronic cystitis with hematuria; History of cesarean delivery, currently pregnant; Supervision of other high risk pregnancies, first trimester; AMA (advanced maternal age) multigravida 85+; Severe obesity (BMI 35.0-39.9) with comorbidity ([place]); H/O pre-eclampsia in prior pregnancy, currently pregnant, first trimester; History of gestational diabetes in prior pregnancy, currently pregnant; Chronic hypertension during pregnancy, antepartum; and Anxiety on their problem list.  Patient reports no complaints.  Contractions: Not present. Vag. Bleeding: None.  Movement: Present. Denies leaking of fluid.   The following portions of the patient's history were reviewed and updated as appropriate: allergies, current medications, past family history, past medical history, past social history, past surgical history and problem list.   Objective:   Vitals:   12/20/18 0830  BP: 131/80  Pulse: 92  Weight: 227 lb (103 kg)    Fetal Status: Fetal Heart Rate (bpm): 160   Movement: Present     General:  Alert, oriented and cooperative. Patient is in no acute distress.  Skin: Skin is warm and dry. No rash noted.   Cardiovascular: Normal heart rate noted  Respiratory: Normal respiratory effort, no problems with respiration noted  Abdomen: Soft, gravid, appropriate for gestational age.  Pain/Pressure: Absent     Pelvic: Cervical exam deferred        Extremities: Normal range of motion.  Edema: Trace  Mental Status: Normal mood and affect. Normal behavior. Normal judgment and thought content.   Assessment and Plan:  Pregnancy: Q6P6195 at [redacted]w[redacted]d 1. Supervision of other high risk pregnancies, first trimester    3. Chronic hypertension during pregnancy, antepartum - on baby asa  4. H/O  pre-eclampsia in prior pregnancy, currently pregnant, first trimester   5. History of cesarean delivery, currently pregnant - She has had 2 VBACs and wants another one - plans to have a doula  6. Severe obesity (BMI 35.0-39.9) with comorbidity ([place]) - offered nutrition consult due to excessive weight gain and its risks, She has already had a consult. I rec'd increased activity or less calories  7. Multigravida of advanced maternal age in third trimester - she declined genetic testing  Preterm labor symptoms and general obstetric precautions including but not limited to vaginal bleeding, contractions, leaking of fluid and fetal movement were reviewed in detail with the patient. Please refer to After Visit Summary for other counseling recommendations.   No follow-ups on file.  Future Appointments  Date Time Provider [place]  01/10/2019  8:15 AM [place] 4 [facility] M[facility]-US  01/10/2019  8:20 AM [facility] NURSE [facility] MFC-US    Emily Filbert, MD

## 2018-12-21 ENCOUNTER — Encounter: Payer: Self-pay | Admitting: *Deleted

## 2018-12-21 ENCOUNTER — Telehealth: Payer: Self-pay | Admitting: Family Medicine

## 2018-12-21 ENCOUNTER — Telehealth: Payer: Self-pay | Admitting: *Deleted

## 2018-12-21 DIAGNOSIS — O24419 Gestational diabetes mellitus in pregnancy, unspecified control: Secondary | ICD-10-CM

## 2018-12-21 LAB — 2HR GTT W 1 HR, CARPENTER, 75 G
Glucose, 1 Hr, Gest: 212 mg/dL — ABNORMAL HIGH (ref 65–179)
Glucose, 2 Hr, Gest: 152 mg/dL (ref 65–152)
Glucose, Fasting, Gest: 105 mg/dL — ABNORMAL HIGH (ref 65–91)

## 2018-12-21 LAB — CBC
HCT: 34.6 % — ABNORMAL LOW (ref 35.0–45.0)
Hemoglobin: 11.2 g/dL — ABNORMAL LOW (ref 11.7–15.5)
MCH: 26.6 pg — ABNORMAL LOW (ref 27.0–33.0)
MCHC: 32.4 g/dL (ref 32.0–36.0)
MCV: 82.2 fL (ref 80.0–100.0)
MPV: 11.5 fL (ref 7.5–12.5)
Platelets: 204 10*3/uL (ref 140–400)
RBC: 4.21 10*6/uL (ref 3.80–5.10)
RDW: 13.5 % (ref 11.0–15.0)
WBC: 8.1 10*3/uL (ref 3.8–10.8)

## 2018-12-21 LAB — RPR: RPR Ser Ql: NONREACTIVE

## 2018-12-21 LAB — HIV ANTIBODY (ROUTINE TESTING W REFLEX): HIV 1&2 Ab, 4th Generation: NONREACTIVE

## 2018-12-21 MED ORDER — BLOOD GLUCOSE MONITOR KIT: PACK | 0 refills | Status: DC

## 2018-12-21 NOTE — Telephone Encounter (Signed)
Sent my chart to pt regarding her gestational diabetes.  Order placed for monitor and accessories along with a referral to N and D for review.  She is to return in 2 weeks for in office visit and she is to bring her CBG's with her each time in office and virtual.

## 2018-12-21 NOTE — Telephone Encounter (Signed)
Called patient to get her schedule for a referral that was sent here from [place] for Gestational diabetes, patient stated she did not want an appointment because she already had one and she hung up.

## 2018-12-23 LAB — CULTURE, OB URINE

## 2018-12-23 LAB — URINE CULTURE, OB REFLEX

## 2018-12-28 NOTE — Progress Notes (Signed)
  Patient was seen on 11/17/2018 for Gestational Diabetes self-management. EDD 03/14/2019. Patient states no history of GDM. Diet history obtained. Patient eats fair variety of all food groups.  The following learning objectives were met by the patient :   States the definition of Gestational Diabetes  States why dietary management is important in controlling blood glucose  Describes the effects of carbohydrates on blood glucose levels  Demonstrates ability to create a balanced meal plan  Demonstrates carbohydrate counting   States when to check blood glucose levels  Demonstrates proper blood glucose monitoring techniques  States the effect of stress and exercise on blood glucose levels  States the importance of limiting caffeine and abstaining from alcohol and smoking  Plan:  Aim for 3 Carb Choices per meal (45 grams) +/- 1 either way  Aim for 1-2 Carbs per snack Begin reading food labels for Total Carbohydrate of foods If OK with your MD, consider  increasing your activity level by walking, Arm Chair Exercises or other activity daily as tolerated Begin checking BG before breakfast and 2 hours after first bite of breakfast, lunch and dinner as directed by MD  Bring Log Book/Sheet and meter to every medical appointment  Take medication if directed by MD  Blood glucose monitor given: One Touch Verio  Patient instructed to monitor glucose levels: FBS: 60 - 95 mg/dl 2 hour: <120 mg/dl  Patient received the following handouts:  Nutrition Diabetes and Pregnancy  Carbohydrate Counting List  BG Log Sheet  Patient will be seen for follow-up as needed.

## 2019-01-03 ENCOUNTER — Encounter: Payer: Self-pay | Admitting: Family Medicine

## 2019-01-04 ENCOUNTER — Ambulatory Visit (INDEPENDENT_AMBULATORY_CARE_PROVIDER_SITE_OTHER): Payer: BC Managed Care – PPO | Admitting: Advanced Practice Midwife

## 2019-01-04 ENCOUNTER — Other Ambulatory Visit: Payer: Self-pay

## 2019-01-04 VITALS — BP 130/86 | HR 99 | Wt 229.0 lb

## 2019-01-04 DIAGNOSIS — R12 Heartburn: Secondary | ICD-10-CM

## 2019-01-04 DIAGNOSIS — K5900 Constipation, unspecified: Secondary | ICD-10-CM

## 2019-01-04 DIAGNOSIS — O24419 Gestational diabetes mellitus in pregnancy, unspecified control: Secondary | ICD-10-CM

## 2019-01-04 DIAGNOSIS — O10919 Unspecified pre-existing hypertension complicating pregnancy, unspecified trimester: Secondary | ICD-10-CM

## 2019-01-04 DIAGNOSIS — O99613 Diseases of the digestive system complicating pregnancy, third trimester: Secondary | ICD-10-CM

## 2019-01-04 DIAGNOSIS — O2243 Hemorrhoids in pregnancy, third trimester: Secondary | ICD-10-CM

## 2019-01-04 DIAGNOSIS — O09891 Supervision of other high risk pregnancies, first trimester: Secondary | ICD-10-CM

## 2019-01-04 DIAGNOSIS — O09893 Supervision of other high risk pregnancies, third trimester: Secondary | ICD-10-CM

## 2019-01-04 DIAGNOSIS — R102 Pelvic and perineal pain: Secondary | ICD-10-CM

## 2019-01-04 DIAGNOSIS — O10913 Unspecified pre-existing hypertension complicating pregnancy, third trimester: Secondary | ICD-10-CM

## 2019-01-04 DIAGNOSIS — Z3A30 30 weeks gestation of pregnancy: Secondary | ICD-10-CM

## 2019-01-04 DIAGNOSIS — O34219 Maternal care for unspecified type scar from previous cesarean delivery: Secondary | ICD-10-CM

## 2019-01-04 DIAGNOSIS — O26893 Other specified pregnancy related conditions, third trimester: Secondary | ICD-10-CM

## 2019-01-04 MED ORDER — LIDOCAINE 5 % EX OINT: 1.0000 "application " | TOPICAL_OINTMENT | CUTANEOUS | 0 refills | Status: DC | PRN

## 2019-01-04 MED ORDER — FAMOTIDINE 20 MG PO TABS: 20.0000 mg | ORAL_TABLET | Freq: Two times a day (BID) | ORAL | 2 refills | Status: DC | PRN

## 2019-01-04 MED ORDER — CYCLOBENZAPRINE HCL 10 MG PO TABS: 5.0000 mg | ORAL_TABLET | Freq: Three times a day (TID) | ORAL | 0 refills | Status: DC | PRN

## 2019-01-04 MED ORDER — POLYETHYLENE GLYCOL 3350 17 GM/SCOOP PO POWD: 17.0000 g | Freq: Every day | ORAL | 0 refills | Status: DC

## 2019-01-04 MED ORDER — DOCUSATE SODIUM 100 MG PO CAPS: 100.0000 mg | ORAL_CAPSULE | Freq: Two times a day (BID) | ORAL | 2 refills | Status: DC | PRN

## 2019-01-04 MED ORDER — COMFORT FIT MATERNITY SUPP MED MISC: 1.0000 | Freq: Every day | 0 refills | Status: DC

## 2019-01-04 NOTE — Progress Notes (Signed)
PRENATAL VISIT NOTE  Subjective:  Summer Padilla is a 38 y.o. [id number] at 7431w1d being seen today for ongoing prenatal care.  She is currently monitored for the following issues for this high-risk pregnancy and has Other chronic cystitis with hematuria; History of cesarean delivery, currently pregnant; Supervision of other high risk pregnancies, first trimester; AMA (advanced maternal age) multigravida 35+; Severe obesity (BMI 35.0-39.9) with comorbidity (HCC); H/O pre-eclampsia in prior pregnancy, currently pregnant, first trimester; History of gestational diabetes in prior pregnancy, currently pregnant; Chronic hypertension during pregnancy, antepartum; and Anxiety on their problem list.  Patient reports pelvic pain, hemorrhoid pain, heartburn.  Contractions: Not present. Vag. Bleeding: None.  Movement: Present. Denies leaking of fluid.   The following portions of the patient's history were reviewed and updated as appropriate: allergies, current medications, past family history, past medical history, past social history, past surgical history and problem list.   Objective:   Vitals:   01/04/19 1404  BP: 130/86  Pulse: 99  Weight: 229 lb (103.9 kg)    Fetal Status: Fetal Heart Rate (bpm): 147   Movement: Present     General:  Alert, oriented and cooperative. Patient is in no acute distress.  Skin: Skin is warm and dry. No rash noted.   Cardiovascular: Normal heart rate noted  Respiratory: Normal respiratory effort, no problems with respiration noted  Abdomen: Soft, gravid, appropriate for gestational age.  Pain/Pressure: Present     Pelvic: Cervical exam deferred       Rectal exam with external hemorrhoids, no evidence of thrombosis.  Extremities: Normal range of motion.  Edema: Trace  Mental Status: Normal mood and affect. Normal behavior. Normal judgment and thought content.   Assessment and Plan:  Pregnancy: [id number] at 3831w1d 1. Gestational diabetes mellitus (GDM), antepartum,  gestational diabetes method of control unspecified --No log today but reviewed glucose values with pt. PP all below 120 per pt. Fastings all below 100 but many above 95.  Dietary changes suggested, including less carbs at dinner and high protein snack at bedtime. --Reevaluate at virtual visit in 2 weeks.   --Will address heartburn (see below) to allow pt improved snack at bedtime  2. Supervision of other high risk pregnancies, first trimester --Hx IOL at 169w5d with preeclampsia with severe features  3. Chronic hypertension during pregnancy, antepartum --stable  4. History of cesarean delivery, currently pregnant --Vaginal delivery, then C/S, then VBAC x 2. Desires VBAC --Consent signed 12/20/18 and in chart  5. Hemorrhoids during pregnancy in third trimester --Painful hemorrhoids, making it difficult to sit at her  Job. --Rx for topical lidocaine.  Recommend sitz baths.  See below for treatment of constipation.  6. Pelvic pain affecting pregnancy in third trimester, antepartum --Pregnancy support belt, Rx printed today. - cyclobenzaprine (FLEXERIL) 10 MG tablet; Take 0.5-1 tablets (5-10 mg total) by mouth every 8 (eight) hours as needed for muscle spasms.  Dispense: 20 tablet; Refill: 0  7. Heartburn during pregnancy in third trimester - famotidine (PEPCID) 20 MG tablet; Take 1 tablet (20 mg total) by mouth 2 (two) times daily as needed for heartburn or indigestion.  Dispense: 60 tablet; Refill: 2  8. Constipation during pregnancy in third trimester --Increase fiber and fluid intake - docusate sodium (COLACE) 100 MG capsule; Take 1 capsule (100 mg total) by mouth 2 (two) times daily as needed.  Dispense: 30 capsule; Refill: 2 - polyethylene glycol powder (GLYCOLAX/MIRALAX) 17 GM/SCOOP powder; Take 17 g by mouth daily.  Dispense: 255 g; Refill: 0  Preterm labor symptoms and general obstetric precautions including but not limited to vaginal bleeding, contractions, leaking of fluid and  fetal movement were reviewed in detail with the patient. Please refer to After Visit Summary for other counseling recommendations.   Return in about 2 weeks (around 01/18/2019).  Future Appointments  Date Time Provider [place]  01/10/2019  8:15 AM [place] 4 [facility]-M[facility] MFC-US  01/10/2019  8:20 AM [place] NURSE [place] MFC-US    [name], CNM

## 2019-01-10 ENCOUNTER — Other Ambulatory Visit: Payer: Self-pay

## 2019-01-10 ENCOUNTER — Encounter (HOSPITAL_COMMUNITY): Payer: Self-pay

## 2019-01-10 ENCOUNTER — Ambulatory Visit (HOSPITAL_COMMUNITY): Payer: BC Managed Care – PPO | Admitting: *Deleted

## 2019-01-10 ENCOUNTER — Other Ambulatory Visit (HOSPITAL_COMMUNITY): Payer: Self-pay | Admitting: *Deleted

## 2019-01-10 ENCOUNTER — Ambulatory Visit (HOSPITAL_COMMUNITY)
Admission: RE | Admit: 2019-01-10 | Discharge: 2019-01-10 | Disposition: A | Discharge status: Home / Self Care | Payer: BC Managed Care – PPO | Source: Ambulatory Visit | Attending: Obstetrics and Gynecology | Admitting: Obstetrics and Gynecology

## 2019-01-10 VITALS — BP 115/78 | HR 98 | Temp 98.5°F

## 2019-01-10 DIAGNOSIS — O09523 Supervision of elderly multigravida, third trimester: Secondary | ICD-10-CM

## 2019-01-10 DIAGNOSIS — Z362 Encounter for other antenatal screening follow-up: Secondary | ICD-10-CM

## 2019-01-10 DIAGNOSIS — O10013 Pre-existing essential hypertension complicating pregnancy, third trimester: Secondary | ICD-10-CM

## 2019-01-10 DIAGNOSIS — O09293 Supervision of pregnancy with other poor reproductive or obstetric history, third trimester: Secondary | ICD-10-CM | POA: Diagnosis not present

## 2019-01-10 DIAGNOSIS — O34219 Maternal care for unspecified type scar from previous cesarean delivery: Secondary | ICD-10-CM

## 2019-01-10 DIAGNOSIS — O24410 Gestational diabetes mellitus in pregnancy, diet controlled: Secondary | ICD-10-CM

## 2019-01-10 DIAGNOSIS — O09529 Supervision of elderly multigravida, unspecified trimester: Secondary | ICD-10-CM | POA: Diagnosis not present

## 2019-01-10 DIAGNOSIS — O99213 Obesity complicating pregnancy, third trimester: Secondary | ICD-10-CM

## 2019-01-10 DIAGNOSIS — O09213 Supervision of pregnancy with history of pre-term labor, third trimester: Secondary | ICD-10-CM

## 2019-01-10 DIAGNOSIS — O10913 Unspecified pre-existing hypertension complicating pregnancy, third trimester: Secondary | ICD-10-CM

## 2019-01-10 DIAGNOSIS — Z3A31 31 weeks gestation of pregnancy: Secondary | ICD-10-CM

## 2019-01-17 ENCOUNTER — Encounter: Payer: Self-pay | Admitting: Family Medicine

## 2019-01-18 ENCOUNTER — Telehealth (INDEPENDENT_AMBULATORY_CARE_PROVIDER_SITE_OTHER): Payer: BC Managed Care – PPO | Admitting: Obstetrics and Gynecology

## 2019-01-18 ENCOUNTER — Other Ambulatory Visit: Payer: Self-pay

## 2019-01-18 DIAGNOSIS — O24410 Gestational diabetes mellitus in pregnancy, diet controlled: Secondary | ICD-10-CM

## 2019-01-18 DIAGNOSIS — O09891 Supervision of other high risk pregnancies, first trimester: Secondary | ICD-10-CM

## 2019-01-18 DIAGNOSIS — Z8632 Personal history of gestational diabetes: Secondary | ICD-10-CM | POA: Insufficient documentation

## 2019-01-18 DIAGNOSIS — Z3A32 32 weeks gestation of pregnancy: Secondary | ICD-10-CM

## 2019-01-18 DIAGNOSIS — O24419 Gestational diabetes mellitus in pregnancy, unspecified control: Secondary | ICD-10-CM | POA: Insufficient documentation

## 2019-01-18 DIAGNOSIS — O99213 Obesity complicating pregnancy, third trimester: Secondary | ICD-10-CM

## 2019-01-18 DIAGNOSIS — O09893 Supervision of other high risk pregnancies, third trimester: Secondary | ICD-10-CM

## 2019-01-18 MED ORDER — METFORMIN HCL 500 MG PO TABS: 500.0000 mg | ORAL_TABLET | Freq: Two times a day (BID) | ORAL | 2 refills | Status: DC

## 2019-01-18 NOTE — Patient Instructions (Signed)

## 2019-01-18 NOTE — Progress Notes (Signed)
TELEHEALTH VIRTUAL OBSTETRICS VISIT ENCOUNTER NOTE  I connected with Kenney Houseman on 01/19/19 at  2:15 PM EDT by telephone at home and verified that I am speaking with the correct person using two identifiers.   I discussed the limitations, risks, security and privacy concerns of performing an evaluation and management service by telephone and the availability of in person appointments. I also discussed with the patient that there may be a patient responsible charge related to this service. The patient expressed understanding and agreed to proceed.  Subjective:  Summer Padilla is a 38 y.o. [id number] at [redacted]w[redacted]d being followed for ongoing prenatal care.  She is currently monitored for the following issues for this high-risk pregnancy and has Other chronic cystitis with hematuria; History of cesarean delivery, currently pregnant; Supervision of other high risk pregnancies, first trimester; AMA (advanced maternal age) multigravida 35+; Severe obesity (BMI 35.0-39.9) with comorbidity (HCC); H/O pre-eclampsia in prior pregnancy, currently pregnant, first trimester; History of gestational diabetes in prior pregnancy, currently pregnant; Chronic hypertension during pregnancy, antepartum; Anxiety; and Gestational diabetes mellitus (GDM) on their problem list.  Patient reports no complaints. Reports fetal movement. Denies any contractions, bleeding or leaking of fluid.   The following portions of the patient's history were reviewed and updated as appropriate: allergies, current medications, past family history, past medical history, past social history, past surgical history and problem list.   Objective:   General:  Alert, oriented and cooperative.   Mental Status: Normal mood and affect perceived. Normal judgment and thought content.  Rest of physical exam deferred due to type of encounter  Assessment and Plan:  Pregnancy: [id number] at [redacted]w[redacted]d  1. Severe obesity (BMI 35.0-39.9) with comorbidity [facility])  May be an omnipod candidate will message Bev/ diabetes coordinator.   2. Supervision of other high risk pregnancies, first trimester  Taking baby BASA BP on 9/14: 115/78  BP today: 147/95, recheck 120/80  3. Diet controlled gestational diabetes mellitus (GDM), antepartum  Only charting fasting BS: 89-106 for the past week. 5/7 readings have been greater than 100.  Postprandial: 101-154, 3/7 readings have been great than 120 9/14 US shows EFW @ 2099 g.  Needs to start antenatal testing this week/or next week. Start metformin 500 mg BID, discussed this with Dr. Alysia Penna.  Continue BASA See MD in the next week for BS management.   Lengthy discussion about importance of testing blood sugars, bringing log book and keeping appointments. Discussed risks of uncontrolled DM in pregnancy including delayed fetal lung maturity, IUFD and shoulder dystocia possibly resulting brachial plexus palsy, brain damage, intrapartum death and extensive obstetric lacerations. Patient verbalizes understanding.   Preterm labor symptoms and general obstetric precautions including but not limited to vaginal bleeding, contractions, leaking of fluid and fetal movement were reviewed in detail with the patient.  I discussed the assessment and treatment plan with the patient. The patient was provided an opportunity to ask questions and all were answered. The patient agreed with the plan and demonstrated an understanding of the instructions. The patient was advised to call back or seek an in-person office evaluation/go to [facility] at [facility] for any urgent or concerning symptoms. Please refer to After Visit Summary for other counseling recommendations.   I provided 15 minutes of non-face-to-face time during this encounter.  Return in about 1 week (around 01/25/2019), or With MD for in person, for BS review, needs to start antenatal testing..  Future Appointments  Date Time Provider Department Center   01/20/2019  8:30 AM [facility] NURSE [facility]  01/24/2019  3:00 PM [facility] NURSE [facility]  01/27/2019  3:15 PM Lavonia Drafts, MD [facility]  02/07/2019  8:30 AM [facility] NURSE [place] MFC-US  02/07/2019  8:30 AM [facility] [place] 1 [facility]-MFC[facility] MFC-US  02/14/2019  8:30 AM [facility] NURSE [place] MFC-US  02/14/2019  8:30 AM [place] 1 WH-MFCUS MFC-US    Noni Saupe, NP Center for [employer]

## 2019-01-18 NOTE — Progress Notes (Signed)
Pt will take BP when she gets off work and put it into [employer]

## 2019-01-20 ENCOUNTER — Inpatient Hospital Stay (HOSPITAL_COMMUNITY)
Admission: AD | Admit: 2019-01-20 | Discharge: 2019-01-20 | Disposition: A | Discharge status: Home / Self Care | Payer: BC Managed Care – PPO | Attending: Obstetrics and Gynecology | Admitting: Obstetrics and Gynecology

## 2019-01-20 ENCOUNTER — Ambulatory Visit (INDEPENDENT_AMBULATORY_CARE_PROVIDER_SITE_OTHER): Payer: BC Managed Care – PPO | Admitting: Certified Nurse Midwife

## 2019-01-20 ENCOUNTER — Other Ambulatory Visit: Payer: Self-pay

## 2019-01-20 ENCOUNTER — Encounter (HOSPITAL_COMMUNITY): Payer: Self-pay

## 2019-01-20 ENCOUNTER — Inpatient Hospital Stay (HOSPITAL_BASED_OUTPATIENT_CLINIC_OR_DEPARTMENT_OTHER): Payer: BC Managed Care – PPO

## 2019-01-20 VITALS — BP 140/82 | HR 87 | Wt 227.0 lb

## 2019-01-20 DIAGNOSIS — Z833 Family history of diabetes mellitus: Secondary | ICD-10-CM | POA: Insufficient documentation

## 2019-01-20 DIAGNOSIS — O10919 Unspecified pre-existing hypertension complicating pregnancy, unspecified trimester: Secondary | ICD-10-CM

## 2019-01-20 DIAGNOSIS — O9989 Other specified diseases and conditions complicating pregnancy, childbirth and the puerperium: Secondary | ICD-10-CM | POA: Diagnosis not present

## 2019-01-20 DIAGNOSIS — O24410 Gestational diabetes mellitus in pregnancy, diet controlled: Secondary | ICD-10-CM

## 2019-01-20 DIAGNOSIS — O10913 Unspecified pre-existing hypertension complicating pregnancy, third trimester: Secondary | ICD-10-CM

## 2019-01-20 DIAGNOSIS — Z79899 Other long term (current) drug therapy: Secondary | ICD-10-CM | POA: Diagnosis not present

## 2019-01-20 DIAGNOSIS — Z3A32 32 weeks gestation of pregnancy: Secondary | ICD-10-CM

## 2019-01-20 DIAGNOSIS — O24419 Gestational diabetes mellitus in pregnancy, unspecified control: Secondary | ICD-10-CM | POA: Insufficient documentation

## 2019-01-20 DIAGNOSIS — O0993 Supervision of high risk pregnancy, unspecified, third trimester: Secondary | ICD-10-CM

## 2019-01-20 DIAGNOSIS — O10013 Pre-existing essential hypertension complicating pregnancy, third trimester: Secondary | ICD-10-CM

## 2019-01-20 DIAGNOSIS — O0990 Supervision of high risk pregnancy, unspecified, unspecified trimester: Secondary | ICD-10-CM

## 2019-01-20 DIAGNOSIS — O09523 Supervision of elderly multigravida, third trimester: Secondary | ICD-10-CM | POA: Diagnosis not present

## 2019-01-20 DIAGNOSIS — Z8049 Family history of malignant neoplasm of other genital organs: Secondary | ICD-10-CM | POA: Insufficient documentation

## 2019-01-20 DIAGNOSIS — O24415 Gestational diabetes mellitus in pregnancy, controlled by oral hypoglycemic drugs: Secondary | ICD-10-CM

## 2019-01-20 DIAGNOSIS — Z803 Family history of malignant neoplasm of breast: Secondary | ICD-10-CM | POA: Diagnosis not present

## 2019-01-20 DIAGNOSIS — O288 Other abnormal findings on antenatal screening of mother: Secondary | ICD-10-CM

## 2019-01-20 DIAGNOSIS — O34219 Maternal care for unspecified type scar from previous cesarean delivery: Secondary | ICD-10-CM

## 2019-01-20 DIAGNOSIS — O163 Unspecified maternal hypertension, third trimester: Secondary | ICD-10-CM | POA: Insufficient documentation

## 2019-01-20 DIAGNOSIS — O99213 Obesity complicating pregnancy, third trimester: Secondary | ICD-10-CM

## 2019-01-20 DIAGNOSIS — R51 Headache: Secondary | ICD-10-CM | POA: Diagnosis not present

## 2019-01-20 DIAGNOSIS — Z3A31 31 weeks gestation of pregnancy: Secondary | ICD-10-CM

## 2019-01-20 DIAGNOSIS — Z3689 Encounter for other specified antenatal screening: Secondary | ICD-10-CM

## 2019-01-20 LAB — CBC WITH DIFFERENTIAL/PLATELET
Abs Immature Granulocytes: 0.05 10*3/uL (ref 0.00–0.07)
Basophils Absolute: 0 10*3/uL (ref 0.0–0.1)
Basophils Relative: 0 %
Eosinophils Absolute: 0.2 10*3/uL (ref 0.0–0.5)
Eosinophils Relative: 3 %
HCT: 35.1 % — ABNORMAL LOW (ref 36.0–46.0)
Hemoglobin: 11.6 g/dL — ABNORMAL LOW (ref 12.0–15.0)
Immature Granulocytes: 1 %
Lymphocytes Relative: 20 %
Lymphs Abs: 1.4 10*3/uL (ref 0.7–4.0)
MCH: 27.5 pg (ref 26.0–34.0)
MCHC: 33 g/dL (ref 30.0–36.0)
MCV: 83.2 fL (ref 80.0–100.0)
Monocytes Absolute: 0.4 10*3/uL (ref 0.1–1.0)
Monocytes Relative: 6 %
Neutro Abs: 5.2 10*3/uL (ref 1.7–7.7)
Neutrophils Relative %: 70 %
Platelets: 195 10*3/uL (ref 150–400)
RBC: 4.22 MIL/uL (ref 3.87–5.11)
RDW: 13.7 % (ref 11.5–15.5)
WBC: 7.3 10*3/uL (ref 4.0–10.5)
nRBC: 0 % (ref 0.0–0.2)

## 2019-01-20 LAB — COMPREHENSIVE METABOLIC PANEL
ALT: 15 U/L (ref 0–44)
AST: 22 U/L (ref 15–41)
Albumin: 2.9 g/dL — ABNORMAL LOW (ref 3.5–5.0)
Alkaline Phosphatase: 90 U/L (ref 38–126)
Anion gap: 10 (ref 5–15)
BUN: 5 mg/dL — ABNORMAL LOW (ref 6–20)
CO2: 20 mmol/L — ABNORMAL LOW (ref 22–32)
Calcium: 9.1 mg/dL (ref 8.9–10.3)
Chloride: 105 mmol/L (ref 98–111)
Creatinine, Ser: 0.52 mg/dL (ref 0.44–1.00)
GFR calc Af Amer: >60 mL/min (ref >60)
GFR calc non Af Amer: >60 mL/min (ref >60)
Glucose, Bld: 178 mg/dL — ABNORMAL HIGH (ref 70–99)
Potassium: 3.5 mmol/L (ref 3.5–5.1)
Sodium: 135 mmol/L (ref 135–145)
Total Bilirubin: 0.7 mg/dL (ref 0.3–1.2)
Total Protein: 6.3 g/dL — ABNORMAL LOW (ref 6.5–8.1)

## 2019-01-20 LAB — PROTEIN / CREATININE RATIO, URINE
Creatinine, Urine: 44.67 mg/dL
Total Protein, Urine: <6 mg/dL

## 2019-01-20 MED ORDER — ACETAMINOPHEN 500 MG PO TABS
1000.0000 mg | ORAL_TABLET | Freq: Once | ORAL | Status: AC
  Administered 2019-01-20: 1000 mg via ORAL
  Filled 2019-01-20: qty 2

## 2019-01-20 MED ORDER — LACTATED RINGERS IV BOLUS
250.0000 mL | Freq: Once | INTRAVENOUS | Status: AC
  Administered 2019-01-20: 250 mL via INTRAVENOUS

## 2019-01-20 MED ORDER — DEXAMETHASONE SODIUM PHOSPHATE 10 MG/ML IJ SOLN
10.0000 mg | Freq: Once | INTRAMUSCULAR | Status: AC
  Administered 2019-01-20: 10 mg via INTRAVENOUS
  Filled 2019-01-20: qty 1

## 2019-01-20 MED ORDER — DIPHENHYDRAMINE HCL 50 MG/ML IJ SOLN
25.0000 mg | Freq: Once | INTRAMUSCULAR | Status: AC
  Administered 2019-01-20: 25 mg via INTRAVENOUS
  Filled 2019-01-20: qty 1

## 2019-01-20 MED ORDER — METOCLOPRAMIDE HCL 5 MG/ML IJ SOLN
10.0000 mg | Freq: Once | INTRAMUSCULAR | Status: AC
  Administered 2019-01-20: 10 mg via INTRAVENOUS
  Filled 2019-01-20: qty 2

## 2019-01-20 NOTE — MAU Provider Note (Addendum)
History    CSN: [id number]  Arrival date and time: 01/20/19 5573   First Provider Initiated Contact with Patient 01/20/19 1031     Chief Complaint  Patient presents with  . Hypertension   Summer Padilla is a 38 y.o [phone] who was sent from [place] during a prenatal visit due to nonreactive stress test.   Patient states that she was woken up by a headache ~3am this morning. She took 56m tylenol to alleviate the pain to no avail. She states that she has been having headaches throughout this pregnancy. Was prescribed fiorcet but noted some medication side effects. Associated symptoms include spots in her vision that resemble "sparklers you use for July 4th celebration." Notes that these spots in her vision began ~1 month ago. Denies lightheadedness, fatigue, shortness of breath, chest pain or RUQ pain. In addition, she denies loss of fluid, vaginal bleeding/spotting. She can feel her baby move.   [place] a failed nonreactive stress test. Her current pregnancy has been complicated by gestational diabetes (diagnosed 09/22), chronic hypertension, prior cesarean section on TOLAC and advanced maternal age. She notes that she was preeclamptic during prior pregnancies.   Hypertension This is a recurrent problem. The current episode started today. The problem has been waxing and waning since onset. Pertinent negatives include no chest pain or shortness of breath. ("spots" in vision )   OB History    Gravida  6   Para  4   Term  3   Preterm  1   AB  1   Living  4     SAB  1   TAB  0   Ectopic  0   Multiple  0   Live Births  1          Past Medical History:  Diagnosis Date  . Anxiety   . Benign hematuria   . Diabetes mellitus without complication [place]   . Gestational diabetes    with second pregnancy  . Gestational HTN    Sept 2012 post partum  . Murmur, cardiac   . Other chronic  cystitis with hematuria 07/20/2012   W/U negative (CT/Cystoscopy)     Past Surgical History:  Procedure Laterality Date  . ABDOMINOPLASTY    . CESAREAN SECTION    . HAND SURGERY      Family History  Problem Relation Age of Onset  . Cancer Mother        cervical  . Diabetes Maternal Aunt   . Hearing loss Maternal Grandmother   . Breast cancer Paternal Aunt   . Breast cancer Paternal Aunt   . Breast cancer Cousin     Social History   Tobacco Use  . Smoking status: Never Smoker  . Smokeless tobacco: Never Used  Substance Use Topics  . Alcohol use: No  . Drug use: No    Allergies: No Known Allergies  Medications Prior to Admission  Medication Sig Dispense Refill Last Dose  . acetaminophen (TYLENOL) 325 MG tablet Take 650 mg by mouth every 6 (six) hours as needed for pain (headacahe).   01/20/2019 at Unknown time  . aspirin EC 81 MG tablet Take 1 tablet (81 mg total) by mouth daily. 100 tablet 5 01/20/2019 at Unknown time  . lidocaine (XYLOCAINE) 5 % ointment Apply 1 application topically as needed. 35.44 g 0 01/20/2019 at Unknown time  . metFORMIN (GLUCOPHAGE) 500 MG tablet Take 1 tablet (500 mg  total) by mouth 2 (two) times daily with a meal. 90 tablet 2 01/20/2019 at Unknown time  . Prenatal Vit-Fe Fumarate-FA (PRENATAL MULTIVITAMIN) TABS tablet Take 1 tablet by mouth every other day. 30 tablet 12 Past Week at Unknown time  . blood glucose meter kit and supplies KIT Dispense based on patient and insurance preference. Use up to four times daily as directed. (FOR ICD-9 250.00, 250.01). 1 each 0   . Butalbital-APAP-Caffeine 50-325-40 MG capsule Take 1-2 capsules by mouth every 6 (six) hours as needed for headache. 30 capsule 3   . cyclobenzaprine (FLEXERIL) 10 MG tablet Take 0.5-1 tablets (5-10 mg total) by mouth every 8 (eight) hours as needed for muscle spasms. 20 tablet 0 Unknown at Unknown time  . docusate sodium (COLACE) 100 MG capsule Take 1 capsule (100 mg total) by mouth 2  (two) times daily as needed. 30 capsule 2 Unknown at Unknown time  . Elastic Bandages & Supports (COMFORT FIT MATERNITY SUPP MED) MISC 1 Device by Does not apply route daily. 1 each 0   . escitalopram (LEXAPRO) 10 MG tablet Take 1 tablet (10 mg total) by mouth daily. (Patient not taking: Reported on 10/18/2018) 30 tablet 12 Unknown at Unknown time  . famotidine (PEPCID) 20 MG tablet Take 1 tablet (20 mg total) by mouth 2 (two) times daily as needed for heartburn or indigestion. 60 tablet 2   . glucose blood test strip Use as instructed QID 100 each 9   . hydrOXYzine (VISTARIL) 25 MG capsule Take 1-2 capsules (25-50 mg total) by mouth 3 (three) times daily as needed. 30 capsule 3 Unknown at Unknown time  . Microlet Lancets MISC 1 Device by Does not apply route 4 (four) times daily. 100 each 9   . Misc. Devices MISC Disp 1 maternity belt to fit.  Pt to use as directed 1 Device 0   . polyethylene glycol powder (GLYCOLAX/MIRALAX) 17 GM/SCOOP powder Take 17 g by mouth daily. 255 g 0     Review of Systems  Constitutional: Positive for fatigue.  Respiratory: Negative for chest tightness and shortness of breath.   Cardiovascular: Negative for chest pain and leg swelling.       Notes left foot/ankle swelling    Gastrointestinal: Negative for abdominal pain.       Denies RUQ pain   Physical Exam   Blood pressure 132/85, pulse 99, temperature 98.1 F (36.7 C), resp. rate 18, height 5' 1"  (1.549 m), weight 103 kg, last menstrual period 05/15/2018, SpO2 100 %, currently breastfeeding.   Vitals:   01/20/19 1030 01/20/19 1046  BP: (!) 141/79 132/85  Pulse: (!) 105 99  Resp:    Temp:    SpO2: 100%     Physical Exam  Constitutional: She is oriented to person, place, and time. She appears well-developed and well-nourished. No distress.  HENT:  Head: Normocephalic.  Cardiovascular: Normal rate and regular rhythm.  Respiratory: Effort normal and breath sounds normal.  GI: Soft. There is no  abdominal tenderness. There is no guarding.  Neurological: She is alert and oriented to person, place, and time.    Results for orders placed or performed during the hospital encounter of 01/20/19 (from the past 24 hour(s))  CBC with Differential/Platelet     Status: Abnormal   Collection Time: 01/20/19 10:55 AM  Result Value Ref Range   WBC 7.3 4.0 - 10.5 K/uL   RBC 4.22 3.87 - 5.11 MIL/uL   Hemoglobin 11.6 (L) 12.0 - 15.0  g/dL   HCT 35.1 (L) 36.0 - 46.0 %   MCV 83.2 80.0 - 100.0 fL   MCH 27.5 26.0 - 34.0 pg   MCHC 33.0 30.0 - 36.0 g/dL   RDW 13.7 11.5 - 15.5 %   Platelets 195 150 - 400 K/uL   nRBC 0.0 0.0 - 0.2 %   Neutrophils Relative % 70 %   Neutro Abs 5.2 1.7 - 7.7 K/uL   Lymphocytes Relative 20 %   Lymphs Abs 1.4 0.7 - 4.0 K/uL   Monocytes Relative 6 %   Monocytes Absolute 0.4 0.1 - 1.0 K/uL   Eosinophils Relative 3 %   Eosinophils Absolute 0.2 0.0 - 0.5 K/uL   Basophils Relative 0 %   Basophils Absolute 0.0 0.0 - 0.1 K/uL   Immature Granulocytes 1 %   Abs Immature Granulocytes 0.05 0.00 - 0.07 K/uL  Comprehensive metabolic panel     Status: Abnormal   Collection Time: 01/20/19 10:55 AM  Result Value Ref Range   Sodium 135 135 - 145 mmol/L   Potassium 3.5 3.5 - 5.1 mmol/L   Chloride 105 98 - 111 mmol/L   CO2 20 (L) 22 - 32 mmol/L   Glucose, Bld 178 (H) 70 - 99 mg/dL   BUN 5 (L) 6 - 20 mg/dL   Creatinine, Ser 0.52 0.44 - 1.00 mg/dL   Calcium 9.1 8.9 - 10.3 mg/dL   Total Protein 6.3 (L) 6.5 - 8.1 g/dL   Albumin 2.9 (L) 3.5 - 5.0 g/dL   AST 22 15 - 41 U/L   ALT 15 0 - 44 U/L   Alkaline Phosphatase 90 38 - 126 U/L   Total Bilirubin 0.7 0.3 - 1.2 mg/dL   GFR calc non Af Amer >60 >60 mL/min   GFR calc Af Amer >60 >60 mL/min   Anion gap 10 5 - 15    MAU Course/MDM  Procedures  Floreen Comber A Brinson 01/20/2019, 11:00 AM   I confirm that I have verified the information documented in the medical student's note and that I have also personally reperformed the history,  physical exam and all medical decision making activities of this service and have verified that all service and findings are accurately documented in this student's note.   --Pt denies visual disturbances on arrival to MAU --Patient endorses anterior headache 6-8/10 on arrival to MAU.  --Reactive NST in MAU: baseline 145, moderate variability, positive accels, no decels --[place]oco: quiet --Patient endorses history of intermittent headaches dizziness and visual disturbances for past 4 weeks --Headache pain relieved from 6-7/10 to 3-4/10 with Tylenol. Pt requesting headache cocktail just prior to original discharge time. Arranged to have daughter pick her up from MAU. --Started Metformin 09/22 but did not administer until 09/23. Only two fasting blood sugars logged by patient since starting Metformin. Reminded to change tracking now that she has started Metformin.  Patient Vitals for the past 24 hrs:  BP Temp Pulse Resp SpO2 Height Weight  01/20/19 1524 (!) 147/83 - 88 - - - -  01/20/19 1304 139/85 - 94 - - - -  01/20/19 1201 137/87 - 100 - - - -  01/20/19 1146 (!) 127/94 - (!) 110 - - - -  01/20/19 1131 129/87 - 99 - - - -  01/20/19 1116 132/78 - (!) 107 - - - -  01/20/19 1101 139/79 - 99 - - - -  01/20/19 1046 132/85 - 99 - - - -  01/20/19 1030 (!) 141/79 - Marland Kitchen)  105 - 100 % - -  01/20/19 1025 - - - - 98 % - -  01/20/19 1024 (!) 144/85 - (!) 102 - - - -  01/20/19 1020 - - - - 100 % - -  01/20/19 1004 140/90 98.1 F (36.7 C) (!) 117 18 - 5' 1"  (1.549 m) 103 kg   Results for orders placed or performed during the hospital encounter of 01/20/19 (from the past 24 hour(s))  Protein / creatinine ratio, urine     Status: None   Collection Time: 01/20/19 10:37 AM  Result Value Ref Range   Creatinine, Urine 44.67 mg/dL   Total Protein, Urine <6 mg/dL   Protein Creatinine Ratio        0.00 - 0.15 mg/mg[Cre]  CBC with Differential/Platelet     Status: Abnormal   Collection Time: 01/20/19 10:55 AM   Result Value Ref Range   WBC 7.3 4.0 - 10.5 K/uL   RBC 4.22 3.87 - 5.11 MIL/uL   Hemoglobin 11.6 (L) 12.0 - 15.0 g/dL   HCT 35.1 (L) 36.0 - 46.0 %   MCV 83.2 80.0 - 100.0 fL   MCH 27.5 26.0 - 34.0 pg   MCHC 33.0 30.0 - 36.0 g/dL   RDW 13.7 11.5 - 15.5 %   Platelets 195 150 - 400 K/uL   nRBC 0.0 0.0 - 0.2 %   Neutrophils Relative % 70 %   Neutro Abs 5.2 1.7 - 7.7 K/uL   Lymphocytes Relative 20 %   Lymphs Abs 1.4 0.7 - 4.0 K/uL   Monocytes Relative 6 %   Monocytes Absolute 0.4 0.1 - 1.0 K/uL   Eosinophils Relative 3 %   Eosinophils Absolute 0.2 0.0 - 0.5 K/uL   Basophils Relative 0 %   Basophils Absolute 0.0 0.0 - 0.1 K/uL   Immature Granulocytes 1 %   Abs Immature Granulocytes 0.05 0.00 - 0.07 K/uL  Comprehensive metabolic panel     Status: Abnormal   Collection Time: 01/20/19 10:55 AM  Result Value Ref Range   Sodium 135 135 - 145 mmol/L   Potassium 3.5 3.5 - 5.1 mmol/L   Chloride 105 98 - 111 mmol/L   CO2 20 (L) 22 - 32 mmol/L   Glucose, Bld 178 (H) 70 - 99 mg/dL   BUN 5 (L) 6 - 20 mg/dL   Creatinine, Ser 0.52 0.44 - 1.00 mg/dL   Calcium 9.1 8.9 - 10.3 mg/dL   Total Protein 6.3 (L) 6.5 - 8.1 g/dL   Albumin 2.9 (L) 3.5 - 5.0 g/dL   AST 22 15 - 41 U/L   ALT 15 0 - 44 U/L   Alkaline Phosphatase 90 38 - 126 U/L   Total Bilirubin 0.7 0.3 - 1.2 mg/dL   GFR calc non Af Amer >60 >60 mL/min   GFR calc Af Amer >60 >60 mL/min   Anion gap 10 5 - 15   Meds ordered this encounter  Medications  . acetaminophen (TYLENOL) tablet 1,000 mg  . metoCLOPramide (REGLAN) injection 10 mg  . diphenhydrAMINE (BENADRYL) injection 25 mg  . dexamethasone (DECADRON) injection 10 mg  . lactated ringers bolus 250 mL    Assessment and Plan  --38 y.o. [id number] at [redacted]w[redacted]d --Reactive tracing in MAU, BPP 8/8 --Chronic HTN not on meds, normal PEC labs --Headache score 3/10 at discharge in setting of history of headaches --Discharge home in stable condition  F/U: Next OB visit 01/27/19  [place] MSN, CNM Certified Nurse Midwife,  Faculty Practice 01/20/19 3:47 PM

## 2019-01-20 NOTE — MAU Note (Signed)
Pt sent from office for elevated b/p.  Non reactive NST in office. Pt c/o mild headache today. Has been seeing little"sparkles in her vision at times none right now.  Fetal movement less than usual.

## 2019-01-20 NOTE — Progress Notes (Signed)
PRENATAL VISIT NOTE  Subjective:  Summer Padilla is a 38 y.o. [id number] at [redacted]w[redacted]d being seen today for ongoing prenatal care.  She is currently monitored for the following issues for this high-risk pregnancy and has Other chronic cystitis with hematuria; History of cesarean delivery, currently pregnant; Supervision of high risk pregnancy, antepartum; AMA (advanced maternal age) multigravida 35+; Severe obesity (BMI 35.0-39.9) with comorbidity (HCC); H/O pre-eclampsia in prior pregnancy, currently pregnant, first trimester; History of gestational diabetes in prior pregnancy, currently pregnant; Chronic hypertension during pregnancy, antepartum; Anxiety; and Gestational diabetes mellitus (GDM) on their problem list.  Patient reports headache.  Contractions: Not present. Vag. Bleeding: None.  Movement: Present. Denies leaking of fluid.   The following portions of the patient's history were reviewed and updated as appropriate: allergies, current medications, past family history, past medical history, past social history, past surgical history and problem list.   Objective:   Vitals:   01/20/19 0816  BP: 140/82  Pulse: 87  Weight: 227 lb (103 kg)    Fetal Status:     Movement: Present     General:  Alert, oriented and cooperative. Patient is in no acute distress.  Skin: Skin is warm and dry. No rash noted.   Cardiovascular: Normal heart rate noted  Respiratory: Normal respiratory effort, no problems with respiration noted  Abdomen: Soft, gravid, appropriate for gestational age.  Pain/Pressure: Present     Pelvic: Cervical exam deferred        Extremities: Normal range of motion.  Edema: Trace  Mental Status: Normal mood and affect. Normal behavior. Normal judgment and thought content.   Assessment and Plan:  Pregnancy: [id number] at [redacted]w[redacted]d 1. Supervision of high risk pregnancy, antepartum - Appointment today added on d/t elevated BP through babyscripts  - Patient reports HA that has been  occurring for 2-3 days, has not taken any medication for HA  - She also reports runny nose, denies hx of allergies or sinuses  - Hx of PEC is previous pregnancies x3  - NST non reactive today  - Sent to MAU for further evaluation: BPP and PEC labs, Joni Reining NP notified and aware   2. Multigravida of advanced maternal age in third trimester  3. History of cesarean delivery, currently pregnant - Plans TOLAC, consent signed on 8/24  4. Gestational diabetes mellitus (GDM) in third trimester controlled on oral hypoglycemic drug - Started on metformin on 9/22, patient reports blood sugar is around the same at this time. Will recheck on Monday to assess effectiveness and if dosing change is needed  - Antenatal testing started this week at 32 weeks, non reactive NST today  - NST: baseline 140/ moderate variability/ 1 15x15 accel/ 1 variable deceleration/ no contractions - sent to MAU for BPP   5. Chronic hypertension during pregnancy, antepartum - BP elevated on babyscripts and today in office, no elevated BP noted in the office prior to today  - BP in office today 140/82 - Review of babyscripts, BP 147/95 on 9/22 and 128/91 this morning  - Based on elevated BP and HA with hx of PEC, patient needs PEC labs - sent to MAU for labs   Preterm labor symptoms and general obstetric precautions including but not limited to vaginal bleeding, contractions, leaking of fluid and fetal movement were reviewed in detail with the patient. Please refer to After Visit Summary for other counseling recommendations.    Future Appointments  Date Time Provider Department Center  01/24/2019  3:00 PM [facility] NURSE [facility]  01/27/2019  3:15 PM Lavonia Drafts, MD [facility]  02/07/2019  8:30 AM [place] NURSE [place] [facility]  02/07/2019  8:30 AM [facility] [place] [facility]  02/14/2019  8:30 AM [place] NURSE [place] [facility]  02/14/2019  8:30 AM [facility] [place] 1 [facility]    Lajean Manes, CNM

## 2019-01-20 NOTE — Discharge Instructions (Signed)
Hypertension During Pregnancy °Hypertension is also called high blood pressure. High blood pressure means that the force of your blood moving in your body is too strong. It can cause problems for you and your baby. Different types of high blood pressure can happen during pregnancy. The types are: °· High blood pressure before you got pregnant. This is called chronic hypertension.  This can continue during your pregnancy. Your doctor will want to keep checking your blood pressure. You may need medicine to keep your blood pressure under control while you are pregnant. You will need follow-up visits after you have your baby. °· High blood pressure that goes up during pregnancy when it was normal before. This is called gestational hypertension. It will usually get better after you have your baby, but your doctor will need to watch your blood pressure to make sure that it is getting better. °· Very high blood pressure during pregnancy. This is called preeclampsia. Very high blood pressure is an emergency that needs to be checked and treated right away. °· You may develop very high blood pressure after giving birth. This is called postpartum preeclampsia. This usually occurs within 48 hours after childbirth but may occur up to 6 weeks after giving birth. This is rare. °How does this affect me? °If you have high blood pressure during pregnancy, you have a higher chance of developing high blood pressure: °· As you get older. °· If you get pregnant again. °In some cases, high blood pressure during pregnancy can cause: °· Stroke. °· Heart attack. °· Damage to the kidneys, lungs, or liver. °· Preeclampsia. °· Jerky movements you cannot control (convulsions or seizures). °· Problems with the placenta. °How does this affect my baby? °Your baby may: °· Be born early. °· Not weigh as much as he or she should. °· Not handle labor well, leading to a c-section birth. °What are the risks? °· Having high blood pressure during a past  pregnancy. °· Being overweight. °· Being 35 years old or older. °· Being pregnant for the first time. °· Being pregnant with more than one baby. °· Becoming pregnant using fertility methods, such as IVF. °· Having other problems, such as diabetes, or kidney disease. °· Having family members who have high blood pressure. °What can I do to lower my risk? ° °· Keep a healthy weight. °· Eat a healthy diet. °· Follow what your doctor tells you about treating any medical problems that you had before becoming pregnant. °It is very important to go to all of your doctor visits. Your doctor will check your blood pressure and make sure that your pregnancy is progressing as it should. Treatment should start early if a problem is found. °How is this treated? °Treatment for high blood pressure during pregnancy can differ depending on the type of high blood pressure you have and how serious it is. °· You may need to take blood pressure medicine. °· If you have been taking medicine for your blood pressure, you may need to change the medicine during pregnancy if it is not safe for your baby. °· If your doctor thinks that you could get very high blood pressure, he or she may tell you to take a low-dose aspirin during your pregnancy. °· If you have very high blood pressure, you may need to stay in the hospital so you and your baby can be watched closely. You may also need to take medicine to lower your blood pressure. This medicine may be given by mouth   or through an IV tube. °· In some cases, if your condition gets worse, you may need to have your baby early. °Follow these instructions at home: °Eating and drinking ° °· Drink enough fluid to keep your pee (urine) pale yellow. °· Avoid caffeine. °Lifestyle °· Do not use any products that contain nicotine or tobacco, such as cigarettes, e-cigarettes, and chewing tobacco. If you need help quitting, ask your doctor. °· Do not use alcohol or drugs. °· Avoid stress. °· Rest and get plenty  of sleep. °· Regular exercise can help. Ask your doctor what kinds of exercise are best for you. °General instructions °· Take over-the-counter and prescription medicines only as told by your doctor. °· Keep all prenatal and follow-up visits as told by your doctor. This is important. °Contact a doctor if: °· You have symptoms that your doctor told you to watch for, such as: °? Headaches. °? Nausea. °? Vomiting. °? Belly (abdominal) pain. °? Dizziness. °? Light-headedness. °Get help right away if: °· You have: °? Very bad belly pain that does not get better with treatment. °? A very bad headache that does not get better. °? Vomiting that does not get better. °? Sudden, fast weight gain. °? Sudden swelling in your hands, ankles, or face. °? Bleeding from your vagina. °? Blood in your pee. °? Blurry vision. °? Double vision. °? Shortness of breath. °? Chest pain. °? Weakness on one side of your body. °? Trouble talking. °· Your baby is not moving as much as usual. °Summary °· High blood pressure is also called hypertension. °· High blood pressure means that the force of your blood moving in your body is too strong. °· High blood pressure can cause problems for you and your baby. °· Keep all follow-up visits as told by your doctor. This is important. °This information is not intended to replace advice given to you by your health care provider. Make sure you discuss any questions you have with your health care provider. °Document Released: 05/17/2010 Document Revised: 08/05/2018 Document Reviewed: 05/11/2018 °Elsevier Patient Education © 2020 [employer] ° °

## 2019-01-20 NOTE — Progress Notes (Signed)
Elevated BP and BRX told her to go to hospital but already had appt here

## 2019-01-23 ENCOUNTER — Inpatient Hospital Stay (HOSPITAL_COMMUNITY)
Admission: AD | Admit: 2019-01-23 | Discharge: 2019-01-23 | Disposition: A | Discharge status: Home / Self Care | Payer: BC Managed Care – PPO | Attending: Obstetrics & Gynecology | Admitting: Obstetrics & Gynecology

## 2019-01-23 ENCOUNTER — Encounter (HOSPITAL_COMMUNITY): Payer: Self-pay | Admitting: *Deleted

## 2019-01-23 ENCOUNTER — Other Ambulatory Visit: Payer: Self-pay

## 2019-01-23 DIAGNOSIS — Z7984 Long term (current) use of oral hypoglycemic drugs: Secondary | ICD-10-CM | POA: Insufficient documentation

## 2019-01-23 DIAGNOSIS — O9989 Other specified diseases and conditions complicating pregnancy, childbirth and the puerperium: Secondary | ICD-10-CM | POA: Diagnosis present

## 2019-01-23 DIAGNOSIS — O133 Gestational [pregnancy-induced] hypertension without significant proteinuria, third trimester: Secondary | ICD-10-CM | POA: Diagnosis not present

## 2019-01-23 DIAGNOSIS — O24415 Gestational diabetes mellitus in pregnancy, controlled by oral hypoglycemic drugs: Secondary | ICD-10-CM | POA: Insufficient documentation

## 2019-01-23 DIAGNOSIS — Z3A32 32 weeks gestation of pregnancy: Secondary | ICD-10-CM | POA: Diagnosis not present

## 2019-01-23 DIAGNOSIS — R51 Headache: Secondary | ICD-10-CM | POA: Insufficient documentation

## 2019-01-23 DIAGNOSIS — Z3689 Encounter for other specified antenatal screening: Secondary | ICD-10-CM

## 2019-01-23 DIAGNOSIS — O99213 Obesity complicating pregnancy, third trimester: Secondary | ICD-10-CM

## 2019-01-23 DIAGNOSIS — O10919 Unspecified pre-existing hypertension complicating pregnancy, unspecified trimester: Secondary | ICD-10-CM

## 2019-01-23 DIAGNOSIS — G8929 Other chronic pain: Secondary | ICD-10-CM

## 2019-01-23 LAB — URINALYSIS, ROUTINE W REFLEX MICROSCOPIC
Bilirubin Urine: NEGATIVE
Glucose, UA: NEGATIVE mg/dL
Ketones, ur: NEGATIVE mg/dL
Leukocytes,Ua: NEGATIVE
Nitrite: NEGATIVE
Protein, ur: NEGATIVE mg/dL
Specific Gravity, Urine: 1.009 (ref 1.005–1.030)
pH: 7 (ref 5.0–8.0)

## 2019-01-23 LAB — PROTEIN / CREATININE RATIO, URINE
Creatinine, Urine: 53.63 mg/dL
Total Protein, Urine: <6 mg/dL

## 2019-01-23 MED ORDER — OXYCODONE HCL 5 MG PO TABS
5.0000 mg | ORAL_TABLET | Freq: Once | ORAL | Status: AC
  Administered 2019-01-23: 5 mg via ORAL
  Filled 2019-01-23: qty 1

## 2019-01-23 NOTE — MAU Provider Note (Signed)
History     CSN: [id number]  Arrival date and time: 01/23/19 0157   First Provider Initiated Contact with Patient 01/23/19 0258      Chief Complaint  Patient presents with  . Hypertension  . Headache    Summer Padilla is a 38 y.o. [id number] at 41w6dwho receives care at [facility]  She presents today for Hypertension and Headache.  She states the headache started around 9pm and is located at the top of her head "like a cap." Patient rates the pain a 7/10 and states she took tylenol around 0130 with no relief in her symptoms.  Patient reports that she took her blood pressure at 0030 and noted it was elevated.  She states that she took it twice after that with continued elevations and came to the hospital.  Patient denies fetal movement and states "she has been very quiet."  However, she states that she has felt movement throughout the day and upon arrival to the MAU.  Patient denies contractions, but reports intermittent pain in her lower abdomen.  She states she is also having leg pain "like when you are going to get your period."  Patient denies vaginal concerns including discharge, leaking, or bleeding.       OB History    Gravida  6   Para  4   Term  3   Preterm  1   AB  1   Living  4     SAB  1   TAB  0   Ectopic  0   Multiple  0   Live Births  4           Past Medical History:  Diagnosis Date  . Anxiety   . Benign hematuria   . [place]   . Gestational diabetes    with second pregnancy  . Gestational HTN    Sept 2012 post partum  . Murmur, cardiac   . 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 ABDOMINOPLASTY    . CESAREAN SECTION    . HAND SURGERY      Family History  Problem Relation Age of Onset  . Cancer Mother        cervical  . Diabetes Maternal Aunt   . Hearing loss Maternal Grandmother   . Breast  cancer Paternal Aunt   . Breast cancer Paternal Aunt   . Breast cancer Cousin     Social History   Tobacco Use  . Smoking status: Never Smoker  . Smokeless tobacco: Never Used  Substance Use Topics  . Alcohol use: No  . Drug use: No    Allergies: No Known Allergies  Medications Prior to Admission  Medication Sig Dispense Refill Last Dose  . acetaminophen (TYLENOL) 325 MG tablet Take 650 mg by mouth every 6 (six) hours as needed for pain (headacahe).   01/23/2019 at Unknown time  . aspirin EC 81 MG tablet Take 1 tablet (81 mg total) by mouth daily. 100 tablet 5 01/23/2019 at Unknown time  . docusate sodium (COLACE) 100 MG capsule Take 1 capsule (100 mg total) by mouth 2 (two) times daily as needed. 30 capsule 2 Past Month at Unknown time  . famotidine (PEPCID) 20 MG tablet Take 1 tablet (20 mg total) by mouth 2 (two) times daily as needed for heartburn or indigestion. 60 tablet 2 01/22/2019 at Unknown time  .  Prenatal Vit-Fe Fumarate-FA (PRENATAL MULTIVITAMIN) TABS tablet Take 1 tablet by mouth every other day. 30 tablet 12 01/22/2019 at Unknown time  . blood glucose meter kit and supplies KIT Dispense based on patient and insurance preference. Use up to four times daily as directed. (FOR ICD-9 250.00, 250.01). 1 each 0   . Butalbital-APAP-Caffeine 50-325-40 MG capsule Take 1-2 capsules by mouth every 6 (six) hours as needed for headache. 30 capsule 3   . cyclobenzaprine (FLEXERIL) 10 MG tablet Take 0.5-1 tablets (5-10 mg total) by mouth every 8 (eight) hours as needed for muscle spasms. 20 tablet 0 More than a month at Unknown time  . Elastic Bandages & Supports (COMFORT FIT MATERNITY SUPP MED) MISC 1 Device by Does not apply route daily. 1 each 0   . escitalopram (LEXAPRO) 10 MG tablet Take 1 tablet (10 mg total) by mouth daily. (Patient not taking: Reported on 10/18/2018) 30 tablet 12   . glucose blood test strip Use as instructed QID 100 each 9   . hydrOXYzine (VISTARIL) 25 MG capsule  Take 1-2 capsules (25-50 mg total) by mouth 3 (three) times daily as needed. 30 capsule 3 More than a month at Unknown time  . lidocaine (XYLOCAINE) 5 % ointment Apply 1 application topically as needed. 35.44 g 0   . metFORMIN (GLUCOPHAGE) 500 MG tablet Take 1 tablet (500 mg total) by mouth 2 (two) times daily with a meal. 90 tablet 2   . Microlet Lancets MISC 1 Device by Does not apply route 4 (four) times daily. 100 each 9   . Misc. Devices MISC Disp 1 maternity belt to fit.  Pt to use as directed 1 Device 0   . polyethylene glycol powder (GLYCOLAX/MIRALAX) 17 GM/SCOOP powder Take 17 g by mouth daily. 255 g 0 More than a month at Unknown time    Review of Systems  Constitutional: Negative for chills and fever.  Gastrointestinal: Positive for abdominal pain. Negative for constipation, diarrhea, nausea and vomiting.  Genitourinary: Positive for pelvic pain. Negative for difficulty urinating, dysuria, vaginal bleeding and vaginal discharge.  Neurological: Positive for headaches. Negative for dizziness and light-headedness.   Physical Exam   Blood pressure 137/85, pulse 81, temperature 98.9 F (37.2 C), resp. rate 18, height 5' 1" (1.549 m), weight 104.3 kg, last menstrual period 05/15/2018, currently breastfeeding.   Vitals:   01/23/19 0222 01/23/19 0230 01/23/19 0245  BP: (!) 141/80 138/89 137/85  Pulse: 77 85 81  Resp: 18    Temp: 98.9 F (37.2 C)    Weight: 104.3 kg    Height: 5' 1" (1.549 m)      Physical Exam  Constitutional: She is oriented to person, place, and time. No distress.  Obese   HENT:  Head: Normocephalic and atraumatic.  Eyes: Conjunctivae are normal.  Neck: Normal range of motion.  Cardiovascular: Normal rate, regular rhythm and normal heart sounds.  Respiratory: Effort normal and breath sounds normal.  GI: Soft.  Musculoskeletal: Normal range of motion.  Neurological: She is alert and oriented to person, place, and time.  Skin: Skin is warm and dry.   Psychiatric: She has a normal mood and affect. Her behavior is normal.    Fetal Assessment 130 bpm, Mod Var, -Decels, +Accels Toco: None graphed  MAU Course   Results for orders placed or performed during the hospital encounter of 01/23/19 (from the past 24 hour(s))  Urinalysis, Routine w reflex microscopic     Status: Abnormal   Collection Time:  01/23/19  2:36 AM  Result Value Ref Range   Color, Urine YELLOW YELLOW   APPearance CLEAR CLEAR   Specific Gravity, Urine 1.009 1.005 - 1.030   pH 7.0 5.0 - 8.0   Glucose, UA NEGATIVE NEGATIVE mg/dL   Hgb urine dipstick MODERATE (A) NEGATIVE   Bilirubin Urine NEGATIVE NEGATIVE   Ketones, ur NEGATIVE NEGATIVE mg/dL   Protein, ur NEGATIVE NEGATIVE mg/dL   Nitrite NEGATIVE NEGATIVE   Leukocytes,Ua NEGATIVE NEGATIVE   RBC / HPF 6-10 0 - 5 RBC/hpf   WBC, UA 0-5 0 - 5 WBC/hpf   Bacteria, UA RARE (A) NONE SEEN   Squamous Epithelial / LPF 0-5 0 - 5   Mucus PRESENT   Protein / creatinine ratio, urine     Status: None   Collection Time: 01/23/19  2:36 AM  Result Value Ref Range   Creatinine, Urine 53.63 mg/dL   Total Protein, Urine <6 mg/dL   Protein Creatinine Ratio        0.00 - 0.15 mg/mg[Cre]   No results found.  MDM PE Labs:UA, PC Ratio EFM  Assessment and Plan  38 year old [id number]  SIUP at 32.6weeks Cat I FT Headache CHTN  -Exam findings discussed. -Discussed blood pressures; one elevation, but otherwise normal ranges. -Will give oxycodone 40m for HA as patient states Fioricet "made me feel some type of way." -Informed patient that PC ratio will be obtained and if abnormal further labs would be performed. -Will await results.  JMaryann ConnersMSN, CNM 01/23/2019, 2:59 AM    Reassessment (4:04 AM) Intractable HA CHTN  Vitals:   01/23/19 0316 01/23/19 0331 01/23/19 0346 01/23/19 0356  BP: 134/65 137/71 104/65   Pulse: 81 85 82   Resp:    14  Temp:    97.8 F (36.6 C)  TempSrc:    Oral  SpO2:    100%  Weight:       Height:       -BP have continued to remain stable. -Patient sleeping in room and reports HA now 6/10. -Agreeable with discharge and monitoring at home.  -NST reactive -PC Ratio remains too low to calculate. -PreEclampsia Precautions -Instructions on who and when call or return to MAU if symptoms worsen or with the onset of new symptoms. -Discharged to home in stable condition.  JMaryann ConnersMSN, CNM Advanced Practice Provider, [facility]

## 2019-01-23 NOTE — MAU Note (Signed)
Pt started having a headache and feeling lightheaded. earlier this evening. Took b/p and it was 136/106. Called on call nurse. Told her to wait 10 min and it was 141/96. Took it just before she came 163/103. Has taken tylenol  About 30 min ago has not helped. Reports fetal movement a little less than usual the past few days.

## 2019-01-23 NOTE — Discharge Instructions (Signed)
General Headache Without Cause A headache is pain or discomfort felt around the head or neck area. The specific cause of a headache may not be found. There are many causes and types of headaches. A few common ones are:  Tension headaches.  Migraine headaches.  Cluster headaches.  Chronic daily headaches. Follow these instructions at home: Watch your condition for any changes. Let your health care provider know about them. Take these steps to help with your condition: Managing pain      Take over-the-counter and prescription medicines only as told by your health care provider.  Lie down in a dark, quiet room when you have a headache.  If directed, put ice on your head and neck area: ? Put ice in a plastic bag. ? Place a towel between your skin and the bag. ? Leave the ice on for 20 minutes, 2-3 times per day.  If directed, apply heat to the affected area. Use the heat source that your health care provider recommends, such as a moist heat pack or a heating pad. ? Place a towel between your skin and the heat source. ? Leave the heat on for 20-30 minutes. ? Remove the heat if your skin turns bright red. This is especially important if you are unable to feel pain, heat, or cold. You may have a greater risk of getting burned.  Keep lights dim if bright lights bother you or make your headaches worse. Eating and drinking  Eat meals on a regular schedule.  If you drink alcohol: ? Limit how much you use to:  0-1 drink a day for women.  0-2 drinks a day for men. ? Be aware of how much alcohol is in your drink. In the [place] one drink equals one 12 oz bottle of beer (355 mL), one 5 oz glass of wine (148 mL), or one 1 oz glass of hard liquor (44 mL).  Stop drinking caffeine, or decrease the amount of caffeine you drink. General instructions   Keep a headache journal to help find out what may trigger your headaches. For example, write down: ? What you eat and drink. ? How much  sleep you get. ? Any change to your diet or medicines.  Try massage or other relaxation techniques.  Limit stress.  Sit up straight, and do not tense your muscles.  Do not use any products that contain nicotine or tobacco, such as cigarettes, e-cigarettes, and chewing tobacco. If you need help quitting, ask your health care provider.  Exercise regularly as told by your health care provider.  Sleep on a regular schedule. Get 7-9 hours of sleep each night, or the amount recommended by your health care provider.  Keep all follow-up visits as told by your health care provider. This is important. Contact a health care provider if:  Your symptoms are not helped by medicine.  You have a headache that is different from the usual headache.  You have nausea or you vomit.  You have a fever. Get help right away if:  Your headache becomes severe quickly.  Your headache gets worse after moderate to intense physical activity.  You have repeated vomiting.  You have a stiff neck.  You have a loss of vision.  You have problems with speech.  You have pain in the eye or ear.  You have muscular weakness or loss of muscle control.  You lose your balance or have trouble walking.  You feel faint or pass out.  You have confusion.    You have a seizure. Summary  A headache is pain or discomfort felt around the head or neck area.  There are many causes and types of headaches. In some cases, the cause may not be found.  Keep a headache journal to help find out what may trigger your headaches. Watch your condition for any changes. Let your health care provider know about them.  Contact a health care provider if you have a headache that is different from the usual headache, or if your symptoms are not helped by medicine.  Get help right away if your headache becomes severe, you vomit, you have a loss of vision, you lose your balance, or you have a seizure. This information is not  intended to replace advice given to you by your health care provider. Make sure you discuss any questions you have with your health care provider. Document Released: 04/14/2005 Document Revised: 11/02/2017 Document Reviewed: 11/02/2017 Elsevier Patient Education  2020 [employer]  

## 2019-01-24 ENCOUNTER — Other Ambulatory Visit: Payer: BC Managed Care – PPO

## 2019-01-26 ENCOUNTER — Ambulatory Visit (INDEPENDENT_AMBULATORY_CARE_PROVIDER_SITE_OTHER): Payer: BC Managed Care – PPO

## 2019-01-26 ENCOUNTER — Other Ambulatory Visit: Payer: Self-pay

## 2019-01-26 VITALS — BP 124/83 | HR 96

## 2019-01-26 DIAGNOSIS — O24415 Gestational diabetes mellitus in pregnancy, controlled by oral hypoglycemic drugs: Secondary | ICD-10-CM

## 2019-01-26 DIAGNOSIS — Z3689 Encounter for other specified antenatal screening: Secondary | ICD-10-CM

## 2019-01-26 NOTE — Progress Notes (Signed)
Pt here for NST only. Pt has no complaints other than has noticed some increased discharge but, denies odor or itching. NST reactive per Kathrene Alu, RN. Pt scheduled for OB appt 01/27/19.

## 2019-01-27 ENCOUNTER — Other Ambulatory Visit: Payer: Self-pay

## 2019-01-27 ENCOUNTER — Ambulatory Visit (INDEPENDENT_AMBULATORY_CARE_PROVIDER_SITE_OTHER): Payer: BC Managed Care – PPO | Admitting: Obstetrics & Gynecology

## 2019-01-27 VITALS — BP 133/82 | HR 101 | Wt 228.0 lb

## 2019-01-27 DIAGNOSIS — O09291 Supervision of pregnancy with other poor reproductive or obstetric history, first trimester: Secondary | ICD-10-CM

## 2019-01-27 DIAGNOSIS — O09299 Supervision of pregnancy with other poor reproductive or obstetric history, unspecified trimester: Secondary | ICD-10-CM

## 2019-01-27 DIAGNOSIS — Z8632 Personal history of gestational diabetes: Secondary | ICD-10-CM

## 2019-01-27 DIAGNOSIS — F419 Anxiety disorder, unspecified: Secondary | ICD-10-CM

## 2019-01-27 DIAGNOSIS — Z3A33 33 weeks gestation of pregnancy: Secondary | ICD-10-CM

## 2019-01-27 DIAGNOSIS — O34219 Maternal care for unspecified type scar from previous cesarean delivery: Secondary | ICD-10-CM

## 2019-01-27 DIAGNOSIS — O09293 Supervision of pregnancy with other poor reproductive or obstetric history, third trimester: Secondary | ICD-10-CM

## 2019-01-27 DIAGNOSIS — O0990 Supervision of high risk pregnancy, unspecified, unspecified trimester: Secondary | ICD-10-CM

## 2019-01-27 DIAGNOSIS — O10919 Unspecified pre-existing hypertension complicating pregnancy, unspecified trimester: Secondary | ICD-10-CM

## 2019-01-27 DIAGNOSIS — O0993 Supervision of high risk pregnancy, unspecified, third trimester: Secondary | ICD-10-CM

## 2019-01-27 DIAGNOSIS — O24410 Gestational diabetes mellitus in pregnancy, diet controlled: Secondary | ICD-10-CM

## 2019-01-27 DIAGNOSIS — O10913 Unspecified pre-existing hypertension complicating pregnancy, third trimester: Secondary | ICD-10-CM

## 2019-01-27 DIAGNOSIS — O09529 Supervision of elderly multigravida, unspecified trimester: Secondary | ICD-10-CM

## 2019-01-27 MED ORDER — GLUCOPHAGE 500 MG PO TABS: ORAL_TABLET | ORAL | 2 refills | Status: DC

## 2019-01-27 NOTE — Progress Notes (Signed)
BP earlier 133/96  10 min later 126/88

## 2019-01-27 NOTE — Progress Notes (Signed)
   PRENATAL VISIT NOTE  Subjective:  Summer Padilla is a 38 y.o. [id number] at [redacted]w[redacted]d being seen today for ongoing prenatal care.  She is currently monitored for the following issues for this high-risk pregnancy and has History of cesarean delivery, currently pregnant; Supervision of high risk pregnancy, antepartum; AMA (advanced maternal age) multigravida 68+; Severe obesity (BMI 35.0-39.9) with comorbidity ([place]); H/O pre-eclampsia in prior pregnancy, currently pregnant, first trimester; History of gestational diabetes in prior pregnancy, currently pregnant; Chronic hypertension during pregnancy, antepartum; Anxiety; and Gestational diabetes mellitus (GDM) on their problem list.  Patient reports no complaints.  Contractions: Not present. Vag. Bleeding: None.  Movement: Present. Denies leaking of fluid.   The following portions of the patient's history were reviewed and updated as appropriate: allergies, current medications, past family history, past medical history, past social history, past surgical history and problem list.   Objective:   Vitals:   01/27/19 1503  BP: 133/82  Pulse: (!) 101  Weight: 228 lb (103.4 kg)    Fetal Status:     Movement: Present     General:  Alert, oriented and cooperative. Patient is in no acute distress.  Skin: Skin is warm and dry. No rash noted.   Cardiovascular: Normal heart rate noted  Respiratory: Normal respiratory effort, no problems with respiration noted  Abdomen: Soft, gravid, appropriate for gestational age.  Pain/Pressure: Present     Pelvic: Cervical exam deferred        Extremities: Normal range of motion.  Edema: Trace  Mental Status: Normal mood and affect. Normal behavior. Normal judgment and thought content.   Assessment and Plan:  Pregnancy: M3N3614 at [redacted]w[redacted]d 1. Supervision of high risk pregnancy, antepartum FHR WNL  2. Severe obesity (BMI 35.0-39.9) with comorbidity ([place])  3. History of gestational diabetes in prior pregnancy,  currently pregnant [place] reviewed. Pt still uniformly has elevated am  Fasting glc levels.  rec increase Metformin to 500mg  q am and 1000 mg at bedtime  4. History of cesarean delivery, currently pregnant S/p c/s x1 with 2 successful VBACs  5. H/O pre-eclampsia in prior pregnancy, currently pregnant, first trimester Pt is taking baby ASA  6. Diet controlled gestational diabetes mellitus (GDM), antepartum On medication- Glucophage  7. Chronic hypertension during pregnancy, antepartum No meds at present BP WNL today- pt reports that it has been abnormal via BS and she has been sent to the hosp.  8. Anxiety  9. Antepartum multigravida of advanced maternal age  Preterm labor symptoms and general obstetric precautions including but not limited to vaginal bleeding, contractions, leaking of fluid and fetal movement were reviewed in detail with the patient. Please refer to After Visit Summary for other counseling recommendations.   No follow-ups on file.  Future Appointments  Date Time Provider [place]  01/27/2019  3:15 PM Lavonia Drafts, MD [facility]  02/07/2019  8:30 AM [place] [facility]  02/07/2019  8:30 AM [facility] [place] 1 [facility]  02/14/2019  8:30 AM [facility] NURSE [facility]  02/14/2019  8:30 AM [facility] [place] 1 [facility]US    Lavonia Drafts, MD

## 2019-01-31 ENCOUNTER — Other Ambulatory Visit: Payer: Self-pay

## 2019-01-31 ENCOUNTER — Ambulatory Visit (HOSPITAL_COMMUNITY): Payer: BC Managed Care – PPO

## 2019-01-31 ENCOUNTER — Ambulatory Visit (HOSPITAL_COMMUNITY)
Admission: RE | Admit: 2019-01-31 | Discharge: 2019-01-31 | Disposition: A | Discharge status: Home / Self Care | Payer: BC Managed Care – PPO | Source: Ambulatory Visit | Attending: Obstetrics & Gynecology | Admitting: Obstetrics & Gynecology

## 2019-01-31 ENCOUNTER — Ambulatory Visit (HOSPITAL_COMMUNITY): Payer: BC Managed Care – PPO | Admitting: *Deleted

## 2019-01-31 ENCOUNTER — Encounter (HOSPITAL_COMMUNITY): Payer: Self-pay

## 2019-01-31 DIAGNOSIS — O10013 Pre-existing essential hypertension complicating pregnancy, third trimester: Secondary | ICD-10-CM | POA: Diagnosis not present

## 2019-01-31 DIAGNOSIS — O09291 Supervision of pregnancy with other poor reproductive or obstetric history, first trimester: Secondary | ICD-10-CM | POA: Diagnosis present

## 2019-01-31 DIAGNOSIS — O24415 Gestational diabetes mellitus in pregnancy, controlled by oral hypoglycemic drugs: Secondary | ICD-10-CM | POA: Diagnosis present

## 2019-01-31 DIAGNOSIS — O09299 Supervision of pregnancy with other poor reproductive or obstetric history, unspecified trimester: Secondary | ICD-10-CM | POA: Diagnosis present

## 2019-01-31 DIAGNOSIS — Z8632 Personal history of gestational diabetes: Secondary | ICD-10-CM | POA: Insufficient documentation

## 2019-01-31 DIAGNOSIS — O24410 Gestational diabetes mellitus in pregnancy, diet controlled: Secondary | ICD-10-CM | POA: Diagnosis not present

## 2019-01-31 DIAGNOSIS — O09523 Supervision of elderly multigravida, third trimester: Secondary | ICD-10-CM

## 2019-01-31 DIAGNOSIS — Z3A34 34 weeks gestation of pregnancy: Secondary | ICD-10-CM

## 2019-02-05 ENCOUNTER — Other Ambulatory Visit: Payer: Self-pay

## 2019-02-05 ENCOUNTER — Encounter (HOSPITAL_COMMUNITY): Payer: Self-pay | Admitting: *Deleted

## 2019-02-05 ENCOUNTER — Inpatient Hospital Stay (HOSPITAL_COMMUNITY): Payer: BC Managed Care – PPO

## 2019-02-05 ENCOUNTER — Inpatient Hospital Stay (HOSPITAL_COMMUNITY)
Admission: AD | Admit: 2019-02-05 | Discharge: 2019-02-09 | DRG: 807 | Disposition: A | Discharge status: Home / Self Care | Payer: BC Managed Care – PPO | Attending: Obstetrics and Gynecology | Admitting: Obstetrics and Gynecology

## 2019-02-05 DIAGNOSIS — Z3A34 34 weeks gestation of pregnancy: Secondary | ICD-10-CM

## 2019-02-05 DIAGNOSIS — O119 Pre-existing hypertension with pre-eclampsia, unspecified trimester: Secondary | ICD-10-CM

## 2019-02-05 DIAGNOSIS — O0990 Supervision of high risk pregnancy, unspecified, unspecified trimester: Secondary | ICD-10-CM

## 2019-02-05 DIAGNOSIS — R1012 Left upper quadrant pain: Secondary | ICD-10-CM | POA: Diagnosis not present

## 2019-02-05 DIAGNOSIS — O09523 Supervision of elderly multigravida, third trimester: Secondary | ICD-10-CM

## 2019-02-05 DIAGNOSIS — O1410 Severe pre-eclampsia, unspecified trimester: Secondary | ICD-10-CM

## 2019-02-05 DIAGNOSIS — O24425 Gestational diabetes mellitus in childbirth, controlled by oral hypoglycemic drugs: Secondary | ICD-10-CM | POA: Diagnosis present

## 2019-02-05 DIAGNOSIS — R55 Syncope and collapse: Secondary | ICD-10-CM | POA: Diagnosis not present

## 2019-02-05 DIAGNOSIS — Z362 Encounter for other antenatal screening follow-up: Secondary | ICD-10-CM | POA: Diagnosis not present

## 2019-02-05 DIAGNOSIS — O9089 Other complications of the puerperium, not elsewhere classified: Secondary | ICD-10-CM | POA: Diagnosis not present

## 2019-02-05 DIAGNOSIS — O24410 Gestational diabetes mellitus in pregnancy, diet controlled: Secondary | ICD-10-CM

## 2019-02-05 DIAGNOSIS — I10 Essential (primary) hypertension: Secondary | ICD-10-CM | POA: Diagnosis present

## 2019-02-05 DIAGNOSIS — O368130 Decreased fetal movements, third trimester, not applicable or unspecified: Secondary | ICD-10-CM | POA: Diagnosis present

## 2019-02-05 DIAGNOSIS — O24415 Gestational diabetes mellitus in pregnancy, controlled by oral hypoglycemic drugs: Secondary | ICD-10-CM

## 2019-02-05 DIAGNOSIS — O10013 Pre-existing essential hypertension complicating pregnancy, third trimester: Secondary | ICD-10-CM

## 2019-02-05 DIAGNOSIS — Z20828 Contact with and (suspected) exposure to other viral communicable diseases: Secondary | ICD-10-CM | POA: Diagnosis present

## 2019-02-05 DIAGNOSIS — O1413 Severe pre-eclampsia, third trimester: Secondary | ICD-10-CM

## 2019-02-05 DIAGNOSIS — O114 Pre-existing hypertension with pre-eclampsia, complicating childbirth: Secondary | ICD-10-CM | POA: Diagnosis not present

## 2019-02-05 DIAGNOSIS — Z8632 Personal history of gestational diabetes: Secondary | ICD-10-CM | POA: Diagnosis present

## 2019-02-05 DIAGNOSIS — O1002 Pre-existing essential hypertension complicating childbirth: Secondary | ICD-10-CM | POA: Diagnosis present

## 2019-02-05 DIAGNOSIS — O113 Pre-existing hypertension with pre-eclampsia, third trimester: Secondary | ICD-10-CM

## 2019-02-05 DIAGNOSIS — O09529 Supervision of elderly multigravida, unspecified trimester: Secondary | ICD-10-CM

## 2019-02-05 DIAGNOSIS — O99214 Obesity complicating childbirth: Secondary | ICD-10-CM | POA: Diagnosis present

## 2019-02-05 DIAGNOSIS — O368190 Decreased fetal movements, unspecified trimester, not applicable or unspecified: Secondary | ICD-10-CM

## 2019-02-05 DIAGNOSIS — O34219 Maternal care for unspecified type scar from previous cesarean delivery: Secondary | ICD-10-CM | POA: Diagnosis present

## 2019-02-05 DIAGNOSIS — O368131 Decreased fetal movements, third trimester, fetus 1: Secondary | ICD-10-CM

## 2019-02-05 DIAGNOSIS — O24419 Gestational diabetes mellitus in pregnancy, unspecified control: Secondary | ICD-10-CM | POA: Diagnosis present

## 2019-02-05 DIAGNOSIS — Z8759 Personal history of other complications of pregnancy, childbirth and the puerperium: Secondary | ICD-10-CM

## 2019-02-05 LAB — CBC
HCT: 34.9 % — ABNORMAL LOW (ref 36.0–46.0)
Hemoglobin: 11.6 g/dL — ABNORMAL LOW (ref 12.0–15.0)
MCH: 27.4 pg (ref 26.0–34.0)
MCHC: 33.2 g/dL (ref 30.0–36.0)
MCV: 82.3 fL (ref 80.0–100.0)
Platelets: 188 10*3/uL (ref 150–400)
RBC: 4.24 MIL/uL (ref 3.87–5.11)
RDW: 13.6 % (ref 11.5–15.5)
WBC: 6.2 10*3/uL (ref 4.0–10.5)
nRBC: 0 % (ref 0.0–0.2)

## 2019-02-05 LAB — TYPE AND SCREEN
ABO/RH(D): O POS
Antibody Screen: NEGATIVE

## 2019-02-05 LAB — PROTEIN / CREATININE RATIO, URINE
Creatinine, Urine: 107.17 mg/dL
Protein Creatinine Ratio: 0.15 mg/mg{Cre} (ref 0.00–0.15)
Total Protein, Urine: 16 mg/dL

## 2019-02-05 LAB — URINALYSIS, ROUTINE W REFLEX MICROSCOPIC
Bacteria, UA: NONE SEEN
Bilirubin Urine: NEGATIVE
Glucose, UA: NEGATIVE mg/dL
Ketones, ur: 20 mg/dL — AB
Leukocytes,Ua: NEGATIVE
Nitrite: NEGATIVE
Protein, ur: NEGATIVE mg/dL
Specific Gravity, Urine: 1.016 (ref 1.005–1.030)
pH: 6 (ref 5.0–8.0)

## 2019-02-05 LAB — COMPREHENSIVE METABOLIC PANEL
ALT: 13 U/L (ref 0–44)
AST: 20 U/L (ref 15–41)
Albumin: 2.7 g/dL — ABNORMAL LOW (ref 3.5–5.0)
Alkaline Phosphatase: 107 U/L (ref 38–126)
Anion gap: 12 (ref 5–15)
BUN: 7 mg/dL (ref 6–20)
CO2: 18 mmol/L — ABNORMAL LOW (ref 22–32)
Calcium: 9.1 mg/dL (ref 8.9–10.3)
Chloride: 105 mmol/L (ref 98–111)
Creatinine, Ser: 0.54 mg/dL (ref 0.44–1.00)
GFR calc Af Amer: >60 mL/min (ref >60)
GFR calc non Af Amer: >60 mL/min (ref >60)
Glucose, Bld: 158 mg/dL — ABNORMAL HIGH (ref 70–99)
Potassium: 3.8 mmol/L (ref 3.5–5.1)
Sodium: 135 mmol/L (ref 135–145)
Total Bilirubin: 0.7 mg/dL (ref 0.3–1.2)
Total Protein: 6.3 g/dL — ABNORMAL LOW (ref 6.5–8.1)

## 2019-02-05 LAB — HEMOGLOBIN A1C
Hgb A1c MFr Bld: 5.8 % — ABNORMAL HIGH (ref 4.8–5.6)
Mean Plasma Glucose: 119.76 mg/dL

## 2019-02-05 LAB — GLUCOSE, CAPILLARY
Glucose-Capillary: 90 mg/dL (ref 70–99)
Glucose-Capillary: 169 mg/dL — ABNORMAL HIGH (ref 70–99)

## 2019-02-05 LAB — SARS CORONAVIRUS 2 (TAT 6-24 HRS): SARS Coronavirus 2: NEGATIVE

## 2019-02-05 LAB — GROUP B STREP BY PCR: Group B strep by PCR: NEGATIVE

## 2019-02-05 LAB — ABO/RH: ABO/RH(D): O POS

## 2019-02-05 LAB — LIPASE, BLOOD: Lipase: 40 U/L (ref 11–51)

## 2019-02-05 MED ORDER — METFORMIN HCL 500 MG PO TABS: 500.0000 mg | ORAL_TABLET | Freq: Every day | ORAL | Status: DC

## 2019-02-05 MED ORDER — OXYCODONE-ACETAMINOPHEN 5-325 MG PO TABS
1.0000 | ORAL_TABLET | ORAL | Status: DC | PRN
  Administered 2019-02-05: 2 via ORAL
  Filled 2019-02-05: qty 2

## 2019-02-05 MED ORDER — BETAMETHASONE SOD PHOS & ACET 6 (3-3) MG/ML IJ SUSP
12.0000 mg | Freq: Once | INTRAMUSCULAR | Status: AC
  Administered 2019-02-05: 12 mg via INTRAMUSCULAR
  Filled 2019-02-05: qty 2

## 2019-02-05 MED ORDER — MAGNESIUM SULFATE 40 GM/1000ML IV SOLN
2.0000 g/h | INTRAVENOUS | Status: DC
  Filled 2019-02-05 (×2): qty 1000

## 2019-02-05 MED ORDER — CALCIUM CARBONATE ANTACID 500 MG PO CHEW: 2.0000 | CHEWABLE_TABLET | ORAL | Status: DC | PRN

## 2019-02-05 MED ORDER — BETAMETHASONE SOD PHOS & ACET 6 (3-3) MG/ML IJ SUSP
12.0000 mg | Freq: Once | INTRAMUSCULAR | Status: DC
  Filled 2019-02-05: qty 2

## 2019-02-05 MED ORDER — LACTATED RINGERS IV SOLN
INTRAVENOUS | Status: DC
  Administered 2019-02-05 – 2019-02-06 (×2): via INTRAVENOUS

## 2019-02-05 MED ORDER — CYCLOBENZAPRINE HCL 10 MG PO TABS
10.0000 mg | ORAL_TABLET | Freq: Once | ORAL | Status: AC
  Administered 2019-02-05: 10 mg via ORAL
  Filled 2019-02-05: qty 1

## 2019-02-05 MED ORDER — ZOLPIDEM TARTRATE 5 MG PO TABS: 5.0000 mg | ORAL_TABLET | Freq: Every evening | ORAL | Status: DC | PRN

## 2019-02-05 MED ORDER — METFORMIN HCL 500 MG PO TABS
1000.0000 mg | ORAL_TABLET | Freq: Every day | ORAL | Status: DC
  Administered 2019-02-05: 22:00:00 1000 mg via ORAL
  Filled 2019-02-05: qty 2

## 2019-02-05 MED ORDER — BETAMETHASONE SOD PHOS & ACET 6 (3-3) MG/ML IJ SUSP: 12.0000 mg | Freq: Once | INTRAMUSCULAR | Status: DC

## 2019-02-05 MED ORDER — HYDROMORPHONE HCL 2 MG/ML IJ SOLN
2.0000 mg | INTRAMUSCULAR | Status: DC | PRN
  Administered 2019-02-05 – 2019-02-06 (×3): 2 mg via INTRAVENOUS
  Filled 2019-02-05 (×3): qty 1

## 2019-02-05 MED ORDER — PRENATAL MULTIVITAMIN CH
1.0000 | ORAL_TABLET | Freq: Every day | ORAL | Status: DC
  Administered 2019-02-05: 1 via ORAL
  Filled 2019-02-05: qty 1

## 2019-02-05 MED ORDER — ACETAMINOPHEN 500 MG PO TABS
1000.0000 mg | ORAL_TABLET | Freq: Once | ORAL | Status: AC
  Administered 2019-02-05: 1000 mg via ORAL
  Filled 2019-02-05: qty 2

## 2019-02-05 MED ORDER — NIFEDIPINE 10 MG PO CAPS
10.0000 mg | ORAL_CAPSULE | Freq: Every day | ORAL | Status: DC
  Administered 2019-02-05: 10 mg via ORAL
  Filled 2019-02-05 (×2): qty 1

## 2019-02-05 MED ORDER — ACETAMINOPHEN 325 MG PO TABS
650.0000 mg | ORAL_TABLET | ORAL | Status: DC | PRN
  Administered 2019-02-05: 650 mg via ORAL
  Filled 2019-02-05: qty 2

## 2019-02-05 MED ORDER — DOCUSATE SODIUM 100 MG PO CAPS
100.0000 mg | ORAL_CAPSULE | Freq: Every day | ORAL | Status: DC
  Administered 2019-02-05: 100 mg via ORAL
  Filled 2019-02-05 (×2): qty 1

## 2019-02-05 MED ORDER — MAGNESIUM SULFATE BOLUS VIA INFUSION
4.0000 g | Freq: Once | INTRAVENOUS | Status: AC
  Administered 2019-02-05: 4 g via INTRAVENOUS
  Filled 2019-02-05: qty 1000

## 2019-02-05 NOTE — MAU Provider Note (Signed)
History     CSN: [id number]  Arrival date and time: 02/05/19 1021   First Provider Initiated Contact with Patient 02/05/19 1126      Chief Complaint  Patient presents with  . Abdominal Pain  . Pelvic Pain  . Headache  . Decreased Fetal Movement   HPI    Summer Padilla is a 38 y.o. female [id number] @ 39w5dhere in MAU with multiple complaints. She does have a history of chronic hypertension not on medication, and headaches. She has never taken anything stronger for her HA other than tylenol. Her HA has been off and on throughout the pregnancy with them recently getting worse. She took 1 gram of tylenol last night which brought her HA down to a 4/10. Since her arrival to MAU she has felt her baby move 1 time. She has noticed decreased fetal movement for more than 1 week. She also attests to LUQ pain that has been off and on for a few days. No vomiting, + nausea. No fever. No RUQ pain. No scotoma.  Taking BASA daily. History of 35 week delivery with last baby d/t pre E.    OB History    Gravida  6   Para  4   Term  3   Preterm  1   AB  1   Living  4     SAB  1   TAB  0   Ectopic  0   Multiple  0   Live Births  4           Past Medical History:  Diagnosis Date  . Anxiety   . Benign hematuria   . [place]   . Gestational diabetes    with second pregnancy  . Gestational HTN    Sept 2012 post partum  . Murmur, cardiac   . 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 ABDOMINOPLASTY    . CESAREAN SECTION    . HAND SURGERY      Family History  Problem Relation Age of Onset  . Cancer Mother        cervical  . Diabetes Maternal Aunt   . Hearing loss Maternal Grandmother   . Breast cancer Paternal Aunt   . Breast cancer Paternal Aunt   . Breast cancer Cousin     Social History   Tobacco Use  . Smoking status:  Never Smoker  . Smokeless tobacco: Never Used  Substance Use Topics  . Alcohol use: No  . Drug use: No    Allergies: No Known Allergies  Medications Prior to Admission  Medication Sig Dispense Refill Last Dose  . acetaminophen (TYLENOL) 325 MG tablet Take 650 mg by mouth every 6 (six) hours as needed for pain (headacahe).     .Marland Kitchenaspirin EC 81 MG tablet Take 1 tablet (81 mg total) by mouth daily. 100 tablet 5   . blood glucose meter kit and supplies KIT Dispense based on patient and insurance preference. Use up to four times daily as directed. (FOR ICD-9 250.00, 250.01). 1 each 0   . Butalbital-APAP-Caffeine 50-325-40 MG capsule Take 1-2 capsules by mouth every 6 (six) hours as needed for headache. 30 capsule 3   . cyclobenzaprine (FLEXERIL) 10 MG tablet Take 0.5-1 tablets (5-10 mg total) by mouth every 8 (eight) hours as needed for muscle spasms. 20 tablet 0   . docusate  sodium (COLACE) 100 MG capsule Take 1 capsule (100 mg total) by mouth 2 (two) times daily as needed. 30 capsule 2   . Elastic Bandages & Supports (COMFORT FIT MATERNITY SUPP MED) MISC 1 Device by Does not apply route daily. 1 each 0   . escitalopram (LEXAPRO) 10 MG tablet Take 1 tablet (10 mg total) by mouth daily. 30 tablet 12   . famotidine (PEPCID) 20 MG tablet Take 1 tablet (20 mg total) by mouth 2 (two) times daily as needed for heartburn or indigestion. 60 tablet 2   . GLUCOPHAGE 500 MG tablet Take 1 tab every morning and 2 tablets at bedtime. 90 tablet 2   . glucose blood test strip Use as instructed QID 100 each 9   . hydrOXYzine (VISTARIL) 25 MG capsule Take 1-2 capsules (25-50 mg total) by mouth 3 (three) times daily as needed. (Patient not taking: Reported on 01/31/2019) 30 capsule 3   . lidocaine (XYLOCAINE) 5 % ointment Apply 1 application topically as needed. 35.44 g 0   . Microlet Lancets MISC 1 Device by Does not apply route 4 (four) times daily. 100 each 9   . Misc. Devices MISC Disp 1 maternity belt to fit.   Pt to use as directed 1 Device 0   . polyethylene glycol powder (GLYCOLAX/MIRALAX) 17 GM/SCOOP powder Take 17 g by mouth daily. 255 g 0   . Prenatal Vit-Fe Fumarate-FA (PRENATAL MULTIVITAMIN) TABS tablet Take 1 tablet by mouth every other day. 30 tablet 12    Results for orders placed or performed during the hospital encounter of 02/05/19 (from the past 48 hour(s))  Urinalysis, Routine w reflex microscopic     Status: Abnormal   Collection Time: 02/05/19 10:57 AM  Result Value Ref Range   Color, Urine YELLOW YELLOW   APPearance HAZY (A) CLEAR   Specific Gravity, Urine 1.016 1.005 - 1.030   pH 6.0 5.0 - 8.0   Glucose, UA NEGATIVE NEGATIVE mg/dL   Hgb urine dipstick MODERATE (A) NEGATIVE   Bilirubin Urine NEGATIVE NEGATIVE   Ketones, ur 20 (A) NEGATIVE mg/dL   Protein, ur NEGATIVE NEGATIVE mg/dL   Nitrite NEGATIVE NEGATIVE   Leukocytes,Ua NEGATIVE NEGATIVE   RBC / HPF 11-20 0 - 5 RBC/hpf   WBC, UA 0-5 0 - 5 WBC/hpf   Bacteria, UA NONE SEEN NONE SEEN   Squamous Epithelial / LPF 11-20 0 - 5   Mucus PRESENT     Comment: Performed at [facility], [address], [place], [place] [address]  Protein / creatinine ratio, urine     Status: None   Collection Time: 02/05/19 10:59 AM  Result Value Ref Range   Creatinine, Urine 107.17 mg/dL   Total Protein, Urine 16 mg/dL    Comment: NO NORMAL RANGE ESTABLISHED FOR THIS TEST   Protein Creatinine Ratio 0.15 0.00 - 0.15 mg/mg[Cre]    Comment: Performed at [facility] [address] [place], [place] [address]  CBC     Status: Abnormal   Collection Time: 02/05/19 11:16 AM  Result Value Ref Range   WBC 6.2 4.0 - 10.5 K/uL   RBC 4.24 3.87 - 5.11 MIL/uL   Hemoglobin 11.6 (L) 12.0 - 15.0 g/dL   HCT 34.9 (L) 36.0 - 46.0 %   MCV 82.3 80.0 - 100.0 fL   MCH 27.4 26.0 - 34.0 pg   MCHC 33.2 30.0 - 36.0 g/dL   RDW 13.6 11.5 - 15.5 %   Platelets  188 150 - 400 K/uL   nRBC 0.0 0.0 - 0.2 %    Comment: Performed at [facility],  [place] [address] [place], [place] [address]  Comprehensive metabolic panel     Status: Abnormal   Collection Time: 02/05/19 11:16 AM  Result Value Ref Range   Sodium 135 135 - 145 mmol/L   Potassium 3.8 3.5 - 5.1 mmol/L   Chloride 105 98 - 111 mmol/L   CO2 18 (L) 22 - 32 mmol/L   Glucose, Bld 158 (H) 70 - 99 mg/dL   BUN 7 6 - 20 mg/dL   Creatinine, Ser 0.54 0.44 - 1.00 mg/dL   Calcium 9.1 8.9 - 10.3 mg/dL   Total Protein 6.3 (L) 6.5 - 8.1 g/dL   Albumin 2.7 (L) 3.5 - 5.0 g/dL   AST 20 15 - 41 U/L   ALT 13 0 - 44 U/L   Alkaline Phosphatase 107 38 - 126 U/L   Total Bilirubin 0.7 0.3 - 1.2 mg/dL   GFR calc non Af Amer >60 >60 mL/min   GFR calc Af Amer >60 >60 mL/min   Anion gap 12 5 - 15    Comment: Performed at [facility] [address] [place], [place] [address]  Lipase, blood     Status: None   Collection Time: 02/05/19 11:16 AM  Result Value Ref Range   Lipase 40 11 - 51 U/L    Comment: Performed at [facility], [place] [address] [place], [place] [address]  Hemoglobin A1c     Status: Abnormal   Collection Time: 02/05/19 11:16 AM  Result Value Ref Range   Hgb A1c MFr Bld 5.8 (H) 4.8 - 5.6 %    Comment: (NOTE) Pre diabetes:          5.7%-6.4% Diabetes:              >6.4% Glycemic control for   <7.0% adults with diabetes    Mean Plasma Glucose 119.76 mg/dL    Comment: Performed at [facility] [address] [place], [place] [address]  Type and screen Dane     Status: None   Collection Time: 02/05/19 11:17 AM  Result Value Ref Range   ABO/RH(D) O POS    Antibody Screen NEG    Sample Expiration      02/08/2019,2359 Performed at [facility], [place] [address], [place], [place] [address]   ABO/Rh     Status: None   Collection Time: 02/05/19 11:17 AM  Result Value Ref Range   ABO/RH(D)      O POS Performed at [facility] [address], [place], [place] [address]   Glucose, capillary     Status: None   Collection  Time: 02/05/19  3:19 PM  Result Value Ref Range   Glucose-Capillary 90 70 - 99 mg/dL   No results found.  Review of Systems  Eyes: Negative for photophobia and visual disturbance.  Gastrointestinal: Positive for abdominal pain.  Neurological: Positive for headaches.   Physical Exam   Blood pressure 140/78, pulse 92, temperature 97.8 F (36.6 C), temperature source Oral, resp. rate 16, height _0  (1.549 m), weight 104.1 kg, last menstrual period 05/15/2018, SpO2 100 %, currently breastfeeding.   Patient Vitals for the past 24 hrs:  BP Temp Temp src Pulse Resp SpO2 Height Weight  02/05/19 1601 (!) 144/87 - - 95 18 - - -  02/05/19  1540 - - - - - 100 % - -  02/05/19 1535 (!) 143/83 - - 86 - - - -  02/05/19 1531 137/82 - - 83 - - - -  02/05/19 1516 (!) 142/83 - - 78 - - - -  02/05/19 1500 (!) 147/83 - - 84 - - - -  02/05/19 1456 (!) 150/91 98.2 F (36.8 C) Oral 81 18 99 % - -  02/05/19 1416 133/68 - - 91 - - - -  02/05/19 1408 (!) 148/85 - - 88 - - - -  02/05/19 1131 (!) 143/82 - - 95 - - - -  02/05/19 1121 140/78 - - 92 - - - -  02/05/19 1056 (!) 146/82 - - (!) 101 - - - -  02/05/19 1036 131/88 97.8 F (36.6 C) Oral 93 16 100 % - -  02/05/19 1030 - - - - - - '5\' 1"'$  (1.549 m) 104.1 kg    Physical Exam  Constitutional: She is oriented to person, place, and time. She appears well-developed and well-nourished. No distress.  HENT:  Head: Normocephalic.  Eyes: Pupils are equal, round, and reactive to light.  GI: Normal appearance. There is abdominal tenderness in the left upper quadrant. There is no rigidity, no rebound and no guarding.  Musculoskeletal: Normal range of motion.  Neurological: She is alert and oriented to person, place, and time. She has normal reflexes. She displays normal reflexes.  Negative clonus   Skin: Skin is warm. She is not diaphoretic.  Psychiatric: Her behavior is normal.   Fetal Tracing: Baseline: 140 bpm Variability: Moderate  Accelerations:  15x15 Decelerations: None Toco: UI  MAU Course  Procedures  MDM  Elevated BP without severe BP readings. PIH labs WNL  Flexeril & Tylenol given> no relief of HA Discussed patient with Dr. Hulan Fray, will offer HA cocktail, patient refused HA cocktail.  Dr. Hulan Fray to admit patient for severe pre E and magnesium initiated.  Betamethasone given  BPP 8/8 Complete US ordered.   Assessment and Plan   A:  1. Severe pre-eclampsia, antepartum   2. Decreased fetal movement   3. Severe preeclampsia     P:  Admit to OB speciality unit Magnesium started BMZ given repeat in 24 hours.    Lezlie Lye, NP 02/05/2019 4:33 PM

## 2019-02-05 NOTE — Progress Notes (Signed)
Patient states that she takes Tylenol for the pain and it makes it better but does not take it completely away. Last took two extra strength Tylenol last night around 0100.

## 2019-02-05 NOTE — Progress Notes (Signed)
Dr. Hulan Fray notified of pt's BP of 162/94 and repeat of 162/83 15 minutes later. Dilaudid 2mg  IV given for pt's c/o headache @2315 . Orders received.

## 2019-02-05 NOTE — MAU Note (Signed)
Summer Padilla is a 38 y.o. at [redacted]w[redacted]d here in MAU reporting: upper left abdominal pain for 3 days, states it is not getting better. Worsening pelvic pain. Also has a headache, has had it for 3 days. No bleeding, LOF. Decreased FM.  Onset of complaint: ongoing  Pain score: headache 4/10, upper abdominal pain 7/10, pelvic pain 10/10  Vitals:   02/05/19 1036  BP: 131/88  Pulse: 93  Resp: 16  Temp: 97.8 F (36.6 C)  SpO2: 100%     FHT:150  Lab orders placed from triage: UA

## 2019-02-05 NOTE — H&P (Signed)
History     CSN: [id number]  Arrival date and time: 02/05/19 1021   First Provider Initiated Contact with Patient 02/05/19 1126      Chief Complaint  Patient presents with  . Abdominal Pain  . Pelvic Pain  . Headache  . Decreased Fetal Movement   HPI    [name] is a 38 y.o. female [id number] @ 39w5dhere in MAU with multiple complaints. She does have a history of chronic hypertension not on medication, and headaches. She has never taken anything stronger for her HA other than tylenol. Her HA has been off and on throughout the pregnancy with them recently getting worse. She took 1 gram of tylenol last night which brought her HA down to a 4/10. Since her arrival to MAU she has felt her baby move 1 time. She has noticed decreased fetal movement for more than 1 week. She also attests to LUQ pain that has been off and on for a few days. No vomiting, + nausea. No fever. No RUQ pain. No scotoma.  Taking BASA daily. History of 35 week delivery with last baby d/t pre E.    OB History    Gravida  6   Para  4   Term  3   Preterm  1   AB  1   Living  4     SAB  1   TAB  0   Ectopic  0   Multiple  0   Live Births  4           Past Medical History:  Diagnosis Date  . Anxiety   . Benign hematuria   . Diabetes [place] without complication [place]   . Gestational diabetes    with second pregnancy  . Gestational HTN    Sept 2012 post partum  . Murmur, cardiac   . 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 ABDOMINOPLASTY    . CESAREAN SECTION    . HAND SURGERY      Family History  Problem Relation Age of Onset  . Cancer Mother        cervical  . Diabetes Maternal Aunt   . Hearing loss Maternal Grandmother   . Breast cancer Paternal Aunt   . Breast cancer Paternal Aunt   . Breast cancer Cousin     Social History   Tobacco Use  . Smoking status:  Never Smoker  . Smokeless tobacco: Never Used  Substance Use Topics  . Alcohol use: No  . Drug use: No    Allergies: No Known Allergies  Medications Prior to Admission  Medication Sig Dispense Refill Last Dose  . acetaminophen (TYLENOL) 325 MG tablet Take 650 mg by mouth every 6 (six) hours as needed for pain (headacahe).     .Marland Kitchenaspirin EC 81 MG tablet Take 1 tablet (81 mg total) by mouth daily. 100 tablet 5   . blood glucose meter kit and supplies KIT Dispense based on patient and insurance preference. Use up to four times daily as directed. (FOR ICD-9 250.00, 250.01). 1 each 0   . Butalbital-APAP-Caffeine 50-325-40 MG capsule Take 1-2 capsules by mouth every 6 (six) hours as needed for headache. 30 capsule 3   . cyclobenzaprine (FLEXERIL) 10 MG tablet Take 0.5-1 tablets (5-10 mg total) by mouth every 8 (eight) hours as needed for muscle spasms. 20 tablet 0   . docusate  sodium (COLACE) 100 MG capsule Take 1 capsule (100 mg total) by mouth 2 (two) times daily as needed. 30 capsule 2   . Elastic Bandages & Supports (COMFORT FIT MATERNITY SUPP MED) MISC 1 Device by Does not apply route daily. 1 each 0   . escitalopram (LEXAPRO) 10 MG tablet Take 1 tablet (10 mg total) by mouth daily. 30 tablet 12   . famotidine (PEPCID) 20 MG tablet Take 1 tablet (20 mg total) by mouth 2 (two) times daily as needed for heartburn or indigestion. 60 tablet 2   . GLUCOPHAGE 500 MG tablet Take 1 tab every morning and 2 tablets at bedtime. 90 tablet 2   . glucose blood test strip Use as instructed QID 100 each 9   . hydrOXYzine (VISTARIL) 25 MG capsule Take 1-2 capsules (25-50 mg total) by mouth 3 (three) times daily as needed. (Patient not taking: Reported on 01/31/2019) 30 capsule 3   . lidocaine (XYLOCAINE) 5 % ointment Apply 1 application topically as needed. 35.44 g 0   . Microlet Lancets MISC 1 Device by Does not apply route 4 (four) times daily. 100 each 9   . Misc. Devices MISC Disp 1 maternity belt to fit.   Pt to use as directed 1 Device 0   . polyethylene glycol powder (GLYCOLAX/MIRALAX) 17 GM/SCOOP powder Take 17 g by mouth daily. 255 g 0   . Prenatal Vit-Fe Fumarate-FA (PRENATAL MULTIVITAMIN) TABS tablet Take 1 tablet by mouth every other day. 30 tablet 12    Results for orders placed or performed during the hospital encounter of 02/05/19 (from the past 48 hour(s))  Urinalysis, Routine w reflex microscopic     Status: Abnormal   Collection Time: 02/05/19 10:57 AM  Result Value Ref Range   Color, Urine YELLOW YELLOW   APPearance HAZY (A) CLEAR   Specific Gravity, Urine 1.016 1.005 - 1.030   pH 6.0 5.0 - 8.0   Glucose, UA NEGATIVE NEGATIVE mg/dL   Hgb urine dipstick MODERATE (A) NEGATIVE   Bilirubin Urine NEGATIVE NEGATIVE   Ketones, ur 20 (A) NEGATIVE mg/dL   Protein, ur NEGATIVE NEGATIVE mg/dL   Nitrite NEGATIVE NEGATIVE   Leukocytes,Ua NEGATIVE NEGATIVE   RBC / HPF 11-20 0 - 5 RBC/hpf   WBC, UA 0-5 0 - 5 WBC/hpf   Bacteria, UA NONE SEEN NONE SEEN   Squamous Epithelial / LPF 11-20 0 - 5   Mucus PRESENT     Comment: Performed at [facility], [address] [place], [place] [address]  Protein / creatinine ratio, urine     Status: None   Collection Time: 02/05/19 10:59 AM  Result Value Ref Range   Creatinine, Urine 107.17 mg/dL   Total Protein, Urine 16 mg/dL    Comment: NO NORMAL RANGE ESTABLISHED FOR THIS TEST   Protein Creatinine Ratio 0.15 0.00 - 0.15 mg/mg[Cre]    Comment: Performed at [facility] [address] [place],  [address]  CBC     Status: Abnormal   Collection Time: 02/05/19 11:16 AM  Result Value Ref Range   WBC 6.2 4.0 - 10.5 K/uL   RBC 4.24 3.87 - 5.11 MIL/uL   Hemoglobin 11.6 (L) 12.0 - 15.0 g/dL   HCT 34.9 (L) 36.0 - 46.0 %   MCV 82.3 80.0 - 100.0 fL   MCH 27.4 26.0 - 34.0 pg   MCHC 33.2 30.0 - 36.0 g/dL   RDW 13.6 11.5 - 15.5 %   Platelets  188 150 - 400 K/uL   nRBC 0.0 0.0 - 0.2 %    Comment: Performed at [facility],  [place] [address] [place], [place] [address]  Comprehensive metabolic panel     Status: Abnormal   Collection Time: 02/05/19 11:16 AM  Result Value Ref Range   Sodium 135 135 - 145 mmol/L   Potassium 3.8 3.5 - 5.1 mmol/L   Chloride 105 98 - 111 mmol/L   CO2 18 (L) 22 - 32 mmol/L   Glucose, Bld 158 (H) 70 - 99 mg/dL   BUN 7 6 - 20 mg/dL   Creatinine, Ser 0.54 0.44 - 1.00 mg/dL   Calcium 9.1 8.9 - 10.3 mg/dL   Total Protein 6.3 (L) 6.5 - 8.1 g/dL   Albumin 2.7 (L) 3.5 - 5.0 g/dL   AST 20 15 - 41 U/L   ALT 13 0 - 44 U/L   Alkaline Phosphatase 107 38 - 126 U/L   Total Bilirubin 0.7 0.3 - 1.2 mg/dL   GFR calc non Af Amer >60 >60 mL/min   GFR calc Af Amer >60 >60 mL/min   Anion gap 12 5 - 15    Comment: Performed at [facility] [address] [place], [place] [address]  Lipase, blood     Status: None   Collection Time: 02/05/19 11:16 AM  Result Value Ref Range   Lipase 40 11 - 51 U/L    Comment: Performed at [facility], [place] [address] [place], [place] [address]  Hemoglobin A1c     Status: Abnormal   Collection Time: 02/05/19 11:16 AM  Result Value Ref Range   Hgb A1c MFr Bld 5.8 (H) 4.8 - 5.6 %    Comment: (NOTE) Pre diabetes:          5.7%-6.4% Diabetes:              >6.4% Glycemic control for   <7.0% adults with diabetes    Mean Plasma Glucose 119.76 mg/dL    Comment: Performed at [facility] [address] [place], [place] [address]  Type and screen Dane     Status: None   Collection Time: 02/05/19 11:17 AM  Result Value Ref Range   ABO/RH(D) O POS    Antibody Screen NEG    Sample Expiration      02/08/2019,2359 Performed at [facility], [place] [address], [place], [place] [address]   ABO/Rh     Status: None   Collection Time: 02/05/19 11:17 AM  Result Value Ref Range   ABO/RH(D)      O POS Performed at [facility] [address], [place], [place] [address]   Glucose, capillary     Status: None   Collection  Time: 02/05/19  3:19 PM  Result Value Ref Range   Glucose-Capillary 90 70 - 99 mg/dL   No results found.  Review of Systems  Eyes: Negative for photophobia and visual disturbance.  Gastrointestinal: Positive for abdominal pain.  Neurological: Positive for headaches.   Physical Exam   Blood pressure 140/78, pulse 92, temperature 97.8 F (36.6 C), temperature source Oral, resp. rate 16, height _0  (1.549 m), weight 104.1 kg, last menstrual period 05/15/2018, SpO2 100 %, currently breastfeeding.   Patient Vitals for the past 24 hrs:  BP Temp Temp src Pulse Resp SpO2 Height Weight  02/05/19 1601 (!) 144/87 - - 95 18 - - -  02/05/19  1540 - - - - - 100 % - -  02/05/19 1535 (!) 143/83 - - 86 - - - -  02/05/19 1531 137/82 - - 83 - - - -  02/05/19 1516 (!) 142/83 - - 78 - - - -  02/05/19 1500 (!) 147/83 - - 84 - - - -  02/05/19 1456 (!) 150/91 98.2 F (36.8 C) Oral 81 18 99 % - -  02/05/19 1416 133/68 - - 91 - - - -  02/05/19 1408 (!) 148/85 - - 88 - - - -  02/05/19 1131 (!) 143/82 - - 95 - - - -  02/05/19 1121 140/78 - - 92 - - - -  02/05/19 1056 (!) 146/82 - - (!) 101 - - - -  02/05/19 1036 131/88 97.8 F (36.6 C) Oral 93 16 100 % - -  02/05/19 1030 - - - - - - _0  (1.549 m) 104.1 kg    Physical Exam  Constitutional: She is oriented to person, place, and time. She appears well-developed and well-nourished. No distress.  HENT:  Head: Normocephalic.  Eyes: Pupils are equal, round, and reactive to light.  GI: Normal appearance. There is abdominal tenderness in the left upper quadrant. There is no rigidity, no rebound and no guarding.  Musculoskeletal: Normal range of motion.  Neurological: She is alert and oriented to person, place, and time. She has normal reflexes. She displays normal reflexes.  Negative clonus   Skin: Skin is warm. She is not diaphoretic.  Psychiatric: Her behavior is normal.    MAU Course  Procedures  MDM  Elevated BP without severe BP readings.  PIH labs WNL  Flexeril & Tylenol given> no relief of HA Discussed patient with Dr. Hulan Fray, will offer HA cocktail, patient refused HA cocktail.  Dr. Hulan Fray to admit patient for severe pre E and magnesium initiated.  Betamethasone given  BPP 8/8 Complete US ordered.  Category 1 fetal tracing.   Assessment and Plan   A:  1. Severe pre-eclampsia, antepartum   2. Decreased fetal movement   3. Severe preeclampsia     P:  Admit to OB speciality unit Magnesium started BMZ given repeat in 24 hours.    Lezlie Lye, NP 02/05/2019 4:33 PM

## 2019-02-06 DIAGNOSIS — O99214 Obesity complicating childbirth: Secondary | ICD-10-CM | POA: Diagnosis present

## 2019-02-06 DIAGNOSIS — O368131 Decreased fetal movements, third trimester, fetus 1: Secondary | ICD-10-CM

## 2019-02-06 DIAGNOSIS — O34219 Maternal care for unspecified type scar from previous cesarean delivery: Secondary | ICD-10-CM | POA: Diagnosis present

## 2019-02-06 DIAGNOSIS — Z3A34 34 weeks gestation of pregnancy: Secondary | ICD-10-CM

## 2019-02-06 DIAGNOSIS — O24425 Gestational diabetes mellitus in childbirth, controlled by oral hypoglycemic drugs: Secondary | ICD-10-CM | POA: Diagnosis present

## 2019-02-06 DIAGNOSIS — O368130 Decreased fetal movements, third trimester, not applicable or unspecified: Secondary | ICD-10-CM | POA: Diagnosis present

## 2019-02-06 DIAGNOSIS — O9089 Other complications of the puerperium, not elsewhere classified: Secondary | ICD-10-CM | POA: Diagnosis not present

## 2019-02-06 DIAGNOSIS — O1002 Pre-existing essential hypertension complicating childbirth: Secondary | ICD-10-CM | POA: Diagnosis present

## 2019-02-06 DIAGNOSIS — O113 Pre-existing hypertension with pre-eclampsia, third trimester: Secondary | ICD-10-CM

## 2019-02-06 DIAGNOSIS — Z20828 Contact with and (suspected) exposure to other viral communicable diseases: Secondary | ICD-10-CM | POA: Diagnosis present

## 2019-02-06 DIAGNOSIS — R1012 Left upper quadrant pain: Secondary | ICD-10-CM | POA: Diagnosis present

## 2019-02-06 DIAGNOSIS — O1414 Severe pre-eclampsia complicating childbirth: Secondary | ICD-10-CM | POA: Diagnosis not present

## 2019-02-06 DIAGNOSIS — Z3A35 35 weeks gestation of pregnancy: Secondary | ICD-10-CM | POA: Diagnosis not present

## 2019-02-06 DIAGNOSIS — O114 Pre-existing hypertension with pre-eclampsia, complicating childbirth: Secondary | ICD-10-CM | POA: Diagnosis present

## 2019-02-06 DIAGNOSIS — R55 Syncope and collapse: Secondary | ICD-10-CM | POA: Diagnosis not present

## 2019-02-06 LAB — RPR: RPR Ser Ql: NONREACTIVE

## 2019-02-06 LAB — CBC
HCT: 36.6 % (ref 36.0–46.0)
Hemoglobin: 11.9 g/dL — ABNORMAL LOW (ref 12.0–15.0)
MCH: 27.3 pg (ref 26.0–34.0)
MCHC: 32.5 g/dL (ref 30.0–36.0)
MCV: 83.9 fL (ref 80.0–100.0)
Platelets: 200 10*3/uL (ref 150–400)
RBC: 4.36 MIL/uL (ref 3.87–5.11)
RDW: 13.6 % (ref 11.5–15.5)
WBC: 10.9 10*3/uL — ABNORMAL HIGH (ref 4.0–10.5)
nRBC: 0 % (ref 0.0–0.2)

## 2019-02-06 LAB — COMPREHENSIVE METABOLIC PANEL
ALT: 15 U/L (ref 0–44)
AST: 23 U/L (ref 15–41)
Albumin: 3 g/dL — ABNORMAL LOW (ref 3.5–5.0)
Alkaline Phosphatase: 114 U/L (ref 38–126)
Anion gap: 13 (ref 5–15)
BUN: 7 mg/dL (ref 6–20)
CO2: 16 mmol/L — ABNORMAL LOW (ref 22–32)
Calcium: 7.1 mg/dL — ABNORMAL LOW (ref 8.9–10.3)
Chloride: 103 mmol/L (ref 98–111)
Creatinine, Ser: 0.49 mg/dL (ref 0.44–1.00)
GFR calc Af Amer: >60 mL/min (ref >60)
GFR calc non Af Amer: >60 mL/min (ref >60)
Glucose, Bld: 116 mg/dL — ABNORMAL HIGH (ref 70–99)
Potassium: 3.7 mmol/L (ref 3.5–5.1)
Sodium: 132 mmol/L — ABNORMAL LOW (ref 135–145)
Total Bilirubin: 1.3 mg/dL — ABNORMAL HIGH (ref 0.3–1.2)
Total Protein: 6.6 g/dL (ref 6.5–8.1)

## 2019-02-06 LAB — GLUCOSE, CAPILLARY
Glucose-Capillary: 132 mg/dL — ABNORMAL HIGH (ref 70–99)
Glucose-Capillary: 115 mg/dL — ABNORMAL HIGH (ref 70–99)
Glucose-Capillary: 140 mg/dL — ABNORMAL HIGH (ref 70–99)
Glucose-Capillary: 144 mg/dL — ABNORMAL HIGH (ref 70–99)

## 2019-02-06 LAB — MAGNESIUM: Magnesium: 5.2 mg/dL — ABNORMAL HIGH (ref 1.7–2.4)

## 2019-02-06 MED ORDER — PROMETHAZINE HCL 25 MG/ML IJ SOLN
12.5000 mg | Freq: Four times a day (QID) | INTRAMUSCULAR | Status: DC | PRN
  Administered 2019-02-06 – 2019-02-07 (×2): 12.5 mg via INTRAVENOUS
  Filled 2019-02-06 (×2): qty 1

## 2019-02-06 MED ORDER — BETAMETHASONE SOD PHOS & ACET 6 (3-3) MG/ML IJ SUSP
12.0000 mg | Freq: Once | INTRAMUSCULAR | Status: AC
  Administered 2019-02-06: 12 mg via INTRAMUSCULAR
  Filled 2019-02-06: qty 2

## 2019-02-06 MED ORDER — HYDRALAZINE HCL 20 MG/ML IJ SOLN: 10.0000 mg | INTRAMUSCULAR | Status: DC | PRN

## 2019-02-06 MED ORDER — SODIUM CHLORIDE 0.9 % IV SOLN
5.0000 10*6.[IU] | Freq: Once | INTRAVENOUS | Status: AC
  Administered 2019-02-06: 5 10*6.[IU] via INTRAVENOUS
  Filled 2019-02-06: qty 5

## 2019-02-06 MED ORDER — LABETALOL HCL 5 MG/ML IV SOLN: 40.0000 mg | INTRAVENOUS | Status: DC | PRN

## 2019-02-06 MED ORDER — MAGNESIUM SULFATE 40 GM/1000ML IV SOLN
2.0000 g/h | INTRAVENOUS | Status: AC
  Administered 2019-02-06 – 2019-02-07 (×3): 2 g/h via INTRAVENOUS
  Filled 2019-02-06 (×2): qty 1000

## 2019-02-06 MED ORDER — OXYTOCIN 40 UNITS IN NORMAL SALINE INFUSION - SIMPLE MED
1.0000 m[IU]/min | INTRAVENOUS | Status: DC
  Administered 2019-02-07: 11 m[IU]/min via INTRAVENOUS
  Administered 2019-02-07: 17 m[IU]/min via INTRAVENOUS
  Administered 2019-02-07: 1 m[IU]/min via INTRAVENOUS
  Filled 2019-02-06: qty 1000

## 2019-02-06 MED ORDER — LABETALOL HCL 5 MG/ML IV SOLN
20.0000 mg | INTRAVENOUS | Status: DC | PRN
  Filled 2019-02-06: qty 4

## 2019-02-06 MED ORDER — INSULIN ASPART 100 UNIT/ML ~~LOC~~ SOLN
0.0000 [IU] | SUBCUTANEOUS | Status: DC
  Administered 2019-02-06 – 2019-02-08 (×5): 2 [IU] via SUBCUTANEOUS
  Administered 2019-02-08: 06:00:00 3 [IU] via SUBCUTANEOUS

## 2019-02-06 MED ORDER — OXYCODONE-ACETAMINOPHEN 5-325 MG PO TABS: 2.0000 | ORAL_TABLET | ORAL | Status: DC | PRN

## 2019-02-06 MED ORDER — LACTATED RINGERS IV BOLUS
500.0000 mL | Freq: Once | INTRAVENOUS | Status: AC
  Administered 2019-02-07: 250 mL via INTRAVENOUS

## 2019-02-06 MED ORDER — CYCLOBENZAPRINE HCL 5 MG PO TABS: 10.0000 mg | ORAL_TABLET | Freq: Three times a day (TID) | ORAL | Status: DC | PRN

## 2019-02-06 MED ORDER — LABETALOL HCL 5 MG/ML IV SOLN: 80.0000 mg | INTRAVENOUS | Status: DC | PRN

## 2019-02-06 MED ORDER — ZOLPIDEM TARTRATE 5 MG PO TABS: 5.0000 mg | ORAL_TABLET | Freq: Every evening | ORAL | Status: DC | PRN

## 2019-02-06 MED ORDER — LACTATED RINGERS IV SOLN
INTRAVENOUS | Status: DC
  Administered 2019-02-07: 10:00:00 50 mL/h via INTRAVENOUS

## 2019-02-06 MED ORDER — FLEET ENEMA 7-19 GM/118ML RE ENEM: 1.0000 | ENEMA | Freq: Every day | RECTAL | Status: DC | PRN

## 2019-02-06 MED ORDER — LABETALOL HCL 5 MG/ML IV SOLN
20.0000 mg | INTRAVENOUS | Status: DC | PRN
  Administered 2019-02-07: 20 mg via INTRAVENOUS

## 2019-02-06 MED ORDER — OXYTOCIN BOLUS FROM INFUSION
500.0000 mL | Freq: Once | INTRAVENOUS | Status: AC
  Administered 2019-02-07: 500 mL via INTRAVENOUS

## 2019-02-06 MED ORDER — BUTALBITAL-APAP-CAFFEINE 50-325-40 MG PO TABS
2.0000 | ORAL_TABLET | Freq: Once | ORAL | Status: AC
  Administered 2019-02-06: 2 via ORAL
  Filled 2019-02-06: qty 2

## 2019-02-06 MED ORDER — METFORMIN HCL 500 MG PO TABS
500.0000 mg | ORAL_TABLET | Freq: Two times a day (BID) | ORAL | Status: DC
  Administered 2019-02-06 – 2019-02-08 (×2): 500 mg via ORAL
  Filled 2019-02-06 (×5): qty 1

## 2019-02-06 MED ORDER — OXYCODONE-ACETAMINOPHEN 5-325 MG PO TABS: 1.0000 | ORAL_TABLET | ORAL | Status: DC | PRN

## 2019-02-06 MED ORDER — LACTATED RINGERS IV SOLN: 500.0000 mL | INTRAVENOUS | Status: DC | PRN

## 2019-02-06 MED ORDER — PENICILLIN G 3 MILLION UNITS IVPB - SIMPLE MED
3.0000 10*6.[IU] | INTRAVENOUS | Status: DC
  Administered 2019-02-06 – 2019-02-07 (×8): 3 10*6.[IU] via INTRAVENOUS
  Filled 2019-02-06 (×11): qty 100

## 2019-02-06 MED ORDER — LIDOCAINE HCL (PF) 1 % IJ SOLN: 30.0000 mL | INTRAMUSCULAR | Status: DC | PRN

## 2019-02-06 MED ORDER — TERBUTALINE SULFATE 1 MG/ML IJ SOLN: 0.2500 mg | Freq: Once | INTRAMUSCULAR | Status: DC | PRN

## 2019-02-06 MED ORDER — ONDANSETRON HCL 4 MG/2ML IJ SOLN: 4.0000 mg | Freq: Four times a day (QID) | INTRAMUSCULAR | Status: DC | PRN

## 2019-02-06 MED ORDER — ACETAMINOPHEN 325 MG PO TABS
650.0000 mg | ORAL_TABLET | ORAL | Status: DC | PRN
  Filled 2019-02-06: qty 2

## 2019-02-06 MED ORDER — FENTANYL CITRATE (PF) 100 MCG/2ML IJ SOLN
50.0000 ug | INTRAMUSCULAR | Status: DC | PRN
  Administered 2019-02-06 (×2): 100 ug via INTRAVENOUS
  Filled 2019-02-06 (×2): qty 2

## 2019-02-06 MED ORDER — OXYTOCIN 40 UNITS IN NORMAL SALINE INFUSION - SIMPLE MED: 2.5000 [IU]/h | INTRAVENOUS | Status: DC

## 2019-02-06 MED ORDER — HYDROXYZINE HCL 50 MG PO TABS
50.0000 mg | ORAL_TABLET | Freq: Four times a day (QID) | ORAL | Status: DC | PRN
  Filled 2019-02-06: qty 1

## 2019-02-06 MED ORDER — SOD CITRATE-CITRIC ACID 500-334 MG/5ML PO SOLN
30.0000 mL | ORAL | Status: DC | PRN
  Administered 2019-02-07: 30 mL via ORAL
  Filled 2019-02-06: qty 30

## 2019-02-06 MED ORDER — BUTORPHANOL TARTRATE 1 MG/ML IJ SOLN
1.0000 mg | INTRAMUSCULAR | Status: DC | PRN
  Administered 2019-02-07 (×2): 1 mg via INTRAVENOUS
  Filled 2019-02-06 (×2): qty 1

## 2019-02-06 MED ORDER — ESCITALOPRAM OXALATE 10 MG PO TABS
10.0000 mg | ORAL_TABLET | Freq: Every day | ORAL | Status: DC
  Administered 2019-02-07 – 2019-02-09 (×3): 10 mg via ORAL
  Filled 2019-02-06 (×5): qty 1

## 2019-02-06 MED ORDER — FAMOTIDINE 20 MG PO TABS: 20.0000 mg | ORAL_TABLET | Freq: Two times a day (BID) | ORAL | Status: DC | PRN

## 2019-02-06 NOTE — Progress Notes (Signed)
Notified Dr. Harolyn Rutherford that  Metformin was held this morning because patient was NPO and of fasting blood sugar this morning of 132. No new orders received.

## 2019-02-06 NOTE — Progress Notes (Signed)
LABOR PROGRESS NOTE  Summer Padilla is a 38 y.o. [id number] at [redacted]w[redacted]d  admitted for IOL for severe Pre-E.   Subjective: Patient continues to have headache. Otherwise, feels well. No concerns or questions.   Objective: BP 123/62   Pulse 79   Temp (!) 97.4 F (36.3 C) (Oral)   Resp 20   Ht 5\' 1"  (1.549 m)   Wt 102.9 kg   LMP 05/15/2018   SpO2 98%   BMI 42.84 kg/m  or  Vitals:   02/06/19 1000 02/06/19 1100 02/06/19 1150 02/06/19 1159  BP:   121/64 123/62  Pulse:   88 79  Resp: 16 18 16 20   Temp:   (!) 97.4 F (36.3 C)   TempSrc:   Oral   SpO2:      Weight:      Height:        Dilation: Fingertip Effacement (%): 20 Cervical Position: Middle Station: Ballotable Presentation: Vertex(Confirmed on bedside ultrasound) Exam by:: Dr. Harolyn Rutherford FHT: baseline rate 125, moderate varibility, no acel, no decel Toco: Irregular   Labs: Lab Results  Component Value Date   WBC 10.9 (H) 02/06/2019   HGB 11.9 (L) 02/06/2019   HCT 36.6 02/06/2019   MCV 83.9 02/06/2019   PLT 200 02/06/2019    Patient Active Problem List   Diagnosis Date Noted  . Gestational diabetes mellitus (GDM) 01/18/2019  . Chronic hypertension with superimposed severe preeclampsia 10/25/2018  . Anxiety 10/25/2018  . AMA (advanced maternal age) multigravida 35+ 08/09/2018  . History of eclampsia 08/09/2018  . History of gestational diabetes in prior pregnancy, currently pregnant 08/09/2018  . Severe obesity (BMI 35.0-39.9) with comorbidity ([place]) 12/30/2017  . Supervision of high risk pregnancy, antepartum 08/03/2012  . History of cesarean delivery, currently pregnant 07/27/2012    Assessment / Plan: 38 y.o. [id number] at [redacted]w[redacted]d here for IOL for severe Pre-E. On Mg++. Symptomatic with headache. BPs stable.   Labor: TOLAC. FB placed at 1240. Will plan to start Pitocin once out.  Fetal Wellbeing:  Cat I  Pain Control:  Dilaudid q4h prn for headaches and pain control.  Anticipated MOD:  NSVD   Phill Myron, D.O. OB Fellow  02/06/2019, 12:43 PM

## 2019-02-06 NOTE — Progress Notes (Signed)
Dr Jobe Gibbon notified that pt's last documented output was at 0823 for 500 cc.  Pt assisted to BR at 1415, however pt unable to void.  Also notified that since pt was transferred to unit there have been 3-4 late decels, non repetitive  With some moderate variability in between lates.

## 2019-02-06 NOTE — Consult Note (Signed)
Neonatology Antenatal Consultation Requested: Anyanwu Reason: IOL for pre-eclampsia at 34 weeks  I spoke with the parents today and explained the usual care for babies born at 10-35 weeks, typically not requiring much respiratory support, but requiring tube feeding and thermal support as well as being at higher risk for infection.  We discussed the conditions needed for discharge and the likely duration of hospital care of 2-3 weeks.  R.L. Patterson Hammersmith, M.D.

## 2019-02-06 NOTE — Progress Notes (Signed)
Pt expresses c/o's of inability to void. Urine output was 2125 ml. RN bladder scanned pt for 92 ml.

## 2019-02-06 NOTE — Progress Notes (Signed)
Summer Padilla is a 38 y.o. [id number] at [redacted]w[redacted]d admitted for induction of labor due to Pre-eclamptic toxemia of pregnancy with severe features (Headache).  Subjective: Feeling occasional contractions, but not painful. Foley bulb still in place. Still has headache, nothing has really touched it in terms of pain medication. Feeling baby move occasionally, no leakage of fluid.  Objective: BP (!) 154/81   Pulse (!) 102   Temp 98.2 F (36.8 C) (Oral)   Resp 18   Ht 5\' 1"  (1.549 m)   Wt 102.9 kg   LMP 05/15/2018   SpO2 98%   BMI 42.84 kg/m  Total I/O In: -  Out: 550 [Urine:550]  FHT:  FHR: 135 bpm, variability: moderate,  accelerations:  Present,  decelerations:  Absent UC:   irregular, every 7-12 minutes  SVE:   Dilation: Fingertip Effacement (%): 20 Station: Ballotable Exam by:: Dr. Harolyn Rutherford   Labs: Lab Results  Component Value Date   WBC 10.9 (H) 02/06/2019   HGB 11.9 (L) 02/06/2019   HCT 36.6 02/06/2019   MCV 83.9 02/06/2019   PLT 200 02/06/2019    Assessment / Plan: 38 y.o. [name] at [redacted]w[redacted]d here for IOL for severe Pre-E. On Mg++. Symptomatic with headache. BPs stable.   Labor: TOLAC, FB in place (placed at 1240). Plan to start pitocin around midnight if FB still in place, discussed with Dr. Harolyn Rutherford. Fetal Wellbeing:  Category I Pain Control:  Try phenergan for headache Pre-eclampsia: still has headache. On mag, BPs not in severe range. No other SxS I/D:  GBS neg Anticipated MOD:  vaginal delivery, CS as appropriate  Gestational Diabetes: Sliding scale with CBGs q 4 h. Last CBG 140, if she continues to be >120 may consider glucose stabilizer.   Merilyn Baba DO OB Fellow, [facility] 02/06/2019, 10:05 PM

## 2019-02-06 NOTE — Progress Notes (Signed)
[facility] Attending Note  Subjective:  Patient still has severe headaches, only a little alleviated with Dilaudid.  No visual symptoms. No RUQ/epigastric pain.  FHR reassuring, no contractions, no LOF or vaginal bleeding. Good FM.   Admitted on 02/05/2019 for Chronic hypertension with superimposed severe preeclampsia.    Objective:  Blood pressure 136/74, pulse 95, temperature 97.8 F (36.6 C), temperature source Oral, resp. rate 16, height 5\' 1"  (1.549 m), weight 102.9 kg, last menstrual period 05/15/2018, SpO2 98 %, currently breastfeeding.  Patient Vitals for the past 24 hrs:  BP Temp Temp src Pulse Resp SpO2 Weight  02/06/19 1000 - - - - 16 - -  02/06/19 0900 - - - - 18 - -  02/06/19 0822 136/74 97.8 F (36.6 C) Oral 95 17 98 % -  02/06/19 0800 - - - - 16 - -  02/06/19 0700 - - - - 16 - -  02/06/19 0600 - - - - 17 - -  02/06/19 0522 130/72 97.8 F (36.6 C) Oral 92 18 99 % -  02/06/19 0515 - - - - - - 102.9 kg  02/06/19 0500 - - - - 16 98 % -  02/06/19 0404 (!) 145/91 - - (!) 105 - - -  02/06/19 0400 - 98 F (36.7 C) Oral - 16 - -  02/06/19 0300 - - - - 17 - -  02/06/19 0100 (!) 149/80 - - 98 17 98 % -  02/06/19 0016 (!) 152/89 - - (!) 102 - - -  02/06/19 0000 (!) 161/88 - - (!) 101 17 97 % -  02/05/19 2350 - - - - - 97 % -  02/05/19 2322 (!) 162/83 - - (!) 102 - - -  02/05/19 2320 - - - - - 99 % -  02/05/19 2315 - - - - - 98 % -  02/05/19 2308 (!) 162/94 - - (!) 105 - - -  02/05/19 2300 - 98.4 F (36.9 C) Oral - 17 98 % -  02/05/19 2253 (!) 153/85 - - (!) 102 - - -  02/05/19 2200 - - - - 18 - -  02/05/19 2100 135/83 - - (!) 107 17 99 % -  02/05/19 2055 - - - - - 99 % -  02/05/19 2045 - - - - - 99 % -  02/05/19 2040 - - - - - 99 % -  02/05/19 2035 - - - - - 98 % -  02/05/19 2030 - - - - - 98 % -  02/05/19 2025 - - - - - 99 % -  02/05/19 2020 - - - - - 99 % -  02/05/19 2005 - - - - - 99 % -  02/05/19 2001 134/79 - - (!) 106 18 - -  02/05/19 2000 - - -  - - 99 % -  02/05/19 1955 - - - - - 100 % -  02/05/19 1945 - - - - - 99 % -  02/05/19 1941 - 98.4 F (36.9 C) Oral - - - -  02/05/19 1900 (!) 144/82 - - (!) 102 16 - -  02/05/19 1803 (!) 141/83 - - (!) 101 18 100 % -  02/05/19 1700 (!) 148/73 97.6 F (36.4 C) Oral 100 18 100 % -  02/05/19 1630 - - - - - 99 % -  02/05/19 1625 - - - - - 100 % -  02/05/19 1620 - - - - - 100 % -  02/05/19 1601 (!) 144/87 - - 95 18 - -  02/05/19 1600 - - - - - 99 % -  02/05/19 1555 - - - - - 99 % -  02/05/19 1550 - - - - - 99 % -  02/05/19 1545 - - - - - 99 % -  02/05/19 1540 - - - - - 100 % -  02/05/19 1535 (!) 143/83 - - 86 - - -  02/05/19 1531 137/82 - - 83 - - -  02/05/19 1516 (!) 142/83 - - 78 - - -  02/05/19 1500 (!) 147/83 - - 84 - - -  02/05/19 1456 (!) 150/91 98.2 F (36.8 C) Oral 81 18 99 % -  02/05/19 1416 133/68 - - 91 - - -  02/05/19 1408 (!) 148/85 - - 88 - - -  02/05/19 1131 (!) 143/82 - - 95 - - -  02/05/19 1121 140/78 - - 92 - - -  02/05/19 1056 (!) 146/82 - - (!) 101 - - -   FHT  Baseline 130 bpm, moderate variability, +accelerations, no decelerations Toco: 3-4 per hour Gen: NAD HENT: Normocephalic, atraumatic Lungs: Normal respiratory effort Heart: Regular rate noted Abdomen: NT, gravid fundus, soft Cervix: Dilation: Fingertip Effacement (%): 20 Cervical Position: Middle Station: Ballotable Presentation: Vertex(Confirmed on bedside ultrasound) Exam by:: Dr. Macon Large Ext: 2+ DTRs, no edema, no cyanosis, negative Homan's sign   Results for orders placed or performed during the hospital encounter of 02/05/19 (from the past 24 hour(s))  Protein / creatinine ratio, urine     Status: None   Collection Time: 02/05/19 10:59 AM  Result Value Ref Range   Creatinine, Urine 107.17 mg/dL   Total Protein, Urine 16 mg/dL   Protein Creatinine Ratio 0.15 0.00 - 0.15 mg/mg[Cre]  CBC     Status: Abnormal   Collection Time: 02/05/19 11:16 AM  Result Value Ref Range   WBC 6.2 4.0 -  10.5 K/uL   RBC 4.24 3.87 - 5.11 MIL/uL   Hemoglobin 11.6 (L) 12.0 - 15.0 g/dL   HCT 27.2 (L) 53.6 - 64.4 %   MCV 82.3 80.0 - 100.0 fL   MCH 27.4 26.0 - 34.0 pg   MCHC 33.2 30.0 - 36.0 g/dL   RDW 03.4 74.2 - 59.5 %   Platelets 188 150 - 400 K/uL   nRBC 0.0 0.0 - 0.2 %  Comprehensive metabolic panel     Status: Abnormal   Collection Time: 02/05/19 11:16 AM  Result Value Ref Range   Sodium 135 135 - 145 mmol/L   Potassium 3.8 3.5 - 5.1 mmol/L   Chloride 105 98 - 111 mmol/L   CO2 18 (L) 22 - 32 mmol/L   Glucose, Bld 158 (H) 70 - 99 mg/dL   BUN 7 6 - 20 mg/dL   Creatinine, Ser 6.38 0.44 - 1.00 mg/dL   Calcium 9.1 8.9 - 75.6 mg/dL   Total Protein 6.3 (L) 6.5 - 8.1 g/dL   Albumin 2.7 (L) 3.5 - 5.0 g/dL   AST 20 15 - 41 U/L   ALT 13 0 - 44 U/L   Alkaline Phosphatase 107 38 - 126 U/L   Total Bilirubin 0.7 0.3 - 1.2 mg/dL   GFR calc non Af Amer >60 >60 mL/min   GFR calc Af Amer >60 >60 mL/min   Anion gap 12 5 - 15  Lipase, blood     Status: None   Collection Time: 02/05/19 11:16 AM  Result Value Ref  Range   Lipase 40 11 - 51 U/L  Hemoglobin A1c     Status: Abnormal   Collection Time: 02/05/19 11:16 AM  Result Value Ref Range   Hgb A1c MFr Bld 5.8 (H) 4.8 - 5.6 %   Mean Plasma Glucose 119.76 mg/dL  Type and screen [place]     Status: None   Collection Time: 02/05/19 11:17 AM  Result Value Ref Range   ABO/RH(D) O POS    Antibody Screen NEG    Sample Expiration      02/08/2019,2359 Performed at [facility], [place] [address] [place], [place] [address]   ABO/Rh     Status: None   Collection Time: 02/05/19 11:17 AM  Result Value Ref Range   ABO/RH(D)      O POS Performed at [facility] [address], [place], [place] [address]   SARS CORONAVIRUS 2 (TAT 6-24 HRS) Nasopharyngeal Nasopharyngeal Swab     Status: None   Collection Time: 02/05/19  2:42 PM   Specimen: Nasopharyngeal Swab  Result Value Ref Range   SARS Coronavirus 2 NEGATIVE  NEGATIVE  Glucose, capillary     Status: None   Collection Time: 02/05/19  3:19 PM  Result Value Ref Range   Glucose-Capillary 90 70 - 99 mg/dL  Group B strep by PCR     Status: None   Collection Time: 02/05/19  6:13 PM   Specimen: Vaginal/Rectal; Genital  Result Value Ref Range   Group B strep by PCR NEGATIVE NEGATIVE  Glucose, capillary     Status: Abnormal   Collection Time: 02/05/19  6:58 PM  Result Value Ref Range   Glucose-Capillary 169 (H) 70 - 99 mg/dL   Comment 1 Notify RN   Glucose, capillary     Status: Abnormal   Collection Time: 02/06/19  5:19 AM  Result Value Ref Range   Glucose-Capillary 132 (H) 70 - 99 mg/dL  CBC     Status: Abnormal   Collection Time: 02/06/19 10:43 AM  Result Value Ref Range   WBC 10.9 (H) 4.0 - 10.5 K/uL   RBC 4.36 3.87 - 5.11 MIL/uL   Hemoglobin 11.9 (L) 12.0 - 15.0 g/dL   HCT 36.6 36.0 - 46.0 %   MCV 83.9 80.0 - 100.0 fL   MCH 27.3 26.0 - 34.0 pg   MCHC 32.5 30.0 - 36.0 g/dL   RDW 13.6 11.5 - 15.5 %   Platelets 200 150 - 400 K/uL   nRBC 0.0 0.0 - 0.2 %    Assessment & Plan:  38 y.o. [id number] at [redacted]w[redacted]d admitted for Chronic hypertension with superimposed severe preeclampsia.  Still has severe headaches, need to proceed with delivery. Patient and FOB agree with plan.  Patient desires TOLAC, does not want RCS if possible.  Foley bulb can be placed for cervical ripening, then will augment with pitocin/AROM as needed. Patient was informed of risk of still needing RCS for uncontrolled severe features, maternal-fetal distress or other emergent indication.  Patient also desires postpartum tubal sterilization. She is GBS PCR negative, will still receive prophylaxis due to prematurity.  BP is stable, will continue magnesium sulfate for eclampsia prophylaxis. Labetalol protocol will be ordered as needed.  Will check CBGs given GDM, continue Metformin 500 mg bid for now  Routine L&D orders placed. L&D team notified.   Continue close  observation.  Verita Schneiders, MD, [place] for [employer], [facility]

## 2019-02-06 NOTE — Progress Notes (Signed)
LABOR PROGRESS NOTE  Summer Padilla is a 38 y.o. [id number] at [redacted]w[redacted]d  admitted for IOL for severe Pre-E.   Subjective: Strip note. Discussed plan of care with RN.   RN notified provider around 1600 that patient had no charted UOP since 830. Reviewed labs from today and kidney function noted to be normal. Ordered 500cc LR bolus. RN called again stating that patient voided in MAU around 12 but did not collect. Discontinued bolus and will monitor.   Objective: BP 127/87   Pulse (!) 116   Temp 97.9 F (36.6 C) (Oral)   Resp 17   Ht 5\' 1"  (1.549 m)   Wt 102.9 kg   LMP 05/15/2018   SpO2 98%   BMI 42.84 kg/m  or  Vitals:   02/06/19 1551 02/06/19 1601 02/06/19 1703 02/06/19 1801  BP:  (!) 143/77 139/77 127/87  Pulse:  96 89 (!) 116  Resp: 17 16 16 17   Temp:  97.9 F (36.6 C)    TempSrc:  Oral    SpO2:      Weight:      Height:        Dilation: Fingertip Effacement (%): 20 Cervical Position: Middle Station: Ballotable Presentation: Vertex(Confirmed on bedside ultrasound) Exam by:: Dr. Harolyn Rutherford FHT: baseline rate 125, moderate varibility, + acel, no decel Toco: Irregular   Labs: Lab Results  Component Value Date   WBC 10.9 (H) 02/06/2019   HGB 11.9 (L) 02/06/2019   HCT 36.6 02/06/2019   MCV 83.9 02/06/2019   PLT 200 02/06/2019    Patient Active Problem List   Diagnosis Date Noted  . Gestational diabetes mellitus (GDM) 01/18/2019  . Chronic hypertension with superimposed severe preeclampsia 10/25/2018  . Anxiety 10/25/2018  . AMA (advanced maternal age) multigravida 35+ 08/09/2018  . History of eclampsia 08/09/2018  . History of gestational diabetes in prior pregnancy, currently pregnant 08/09/2018  . Severe obesity (BMI 35.0-39.9) with comorbidity ([place]) 12/30/2017  . Supervision of high risk pregnancy, antepartum 08/03/2012  . History of cesarean delivery, currently pregnant 07/27/2012    Assessment / Plan: 38 y.o. [id number] at [redacted]w[redacted]d here for IOL for severe Pre-E.  On Mg++. Symptomatic with headache. BPs stable.   Labor: TOLAC. FB remains in place since 1240. Will plan to start Pitocin once out.  Fetal Wellbeing:  Cat I  Pain Control:  Dilaudid q4h prn for headaches and pain control.  Anticipated MOD:  NSVD   Phill Myron, D.O. OB Fellow  02/06/2019, 6:15 PM

## 2019-02-06 NOTE — Progress Notes (Signed)
Pt up to bathroom with assistance.  Pt unable to void

## 2019-02-07 ENCOUNTER — Ambulatory Visit (HOSPITAL_COMMUNITY): Payer: BC Managed Care – PPO

## 2019-02-07 ENCOUNTER — Inpatient Hospital Stay (HOSPITAL_COMMUNITY): Payer: BC Managed Care – PPO | Admitting: Anesthesiology

## 2019-02-07 ENCOUNTER — Encounter (HOSPITAL_COMMUNITY): Payer: Self-pay

## 2019-02-07 DIAGNOSIS — Z3A35 35 weeks gestation of pregnancy: Secondary | ICD-10-CM

## 2019-02-07 DIAGNOSIS — O1414 Severe pre-eclampsia complicating childbirth: Secondary | ICD-10-CM

## 2019-02-07 LAB — COMPREHENSIVE METABOLIC PANEL
ALT: 28 U/L (ref 0–44)
ALT: 33 U/L (ref 0–44)
AST: 33 U/L (ref 15–41)
AST: 40 U/L (ref 15–41)
Albumin: 2.9 g/dL — ABNORMAL LOW (ref 3.5–5.0)
Albumin: 3.1 g/dL — ABNORMAL LOW (ref 3.5–5.0)
Alkaline Phosphatase: 106 U/L (ref 38–126)
Alkaline Phosphatase: 113 U/L (ref 38–126)
Anion gap: 15 (ref 5–15)
Anion gap: 13 (ref 5–15)
BUN: 7 mg/dL (ref 6–20)
BUN: 8 mg/dL (ref 6–20)
CO2: 13 mmol/L — ABNORMAL LOW (ref 22–32)
CO2: 13 mmol/L — ABNORMAL LOW (ref 22–32)
Calcium: 6.9 mg/dL — ABNORMAL LOW (ref 8.9–10.3)
Calcium: 7.1 mg/dL — ABNORMAL LOW (ref 8.9–10.3)
Chloride: 104 mmol/L (ref 98–111)
Chloride: 105 mmol/L (ref 98–111)
Creatinine, Ser: 0.65 mg/dL (ref 0.44–1.00)
Creatinine, Ser: 0.62 mg/dL (ref 0.44–1.00)
GFR calc Af Amer: >60 mL/min (ref >60)
GFR calc Af Amer: >60 mL/min (ref >60)
GFR calc non Af Amer: >60 mL/min (ref >60)
GFR calc non Af Amer: >60 mL/min (ref >60)
Glucose, Bld: 116 mg/dL — ABNORMAL HIGH (ref 70–99)
Glucose, Bld: 137 mg/dL — ABNORMAL HIGH (ref 70–99)
Potassium: 3.8 mmol/L (ref 3.5–5.1)
Potassium: 4.1 mmol/L (ref 3.5–5.1)
Sodium: 132 mmol/L — ABNORMAL LOW (ref 135–145)
Sodium: 131 mmol/L — ABNORMAL LOW (ref 135–145)
Total Bilirubin: 1.1 mg/dL (ref 0.3–1.2)
Total Bilirubin: 1.5 mg/dL — ABNORMAL HIGH (ref 0.3–1.2)
Total Protein: 6.7 g/dL (ref 6.5–8.1)
Total Protein: 7.3 g/dL (ref 6.5–8.1)

## 2019-02-07 LAB — CBC
HCT: 36.9 % (ref 36.0–46.0)
HCT: 37.2 % (ref 36.0–46.0)
HCT: 36 % (ref 36.0–46.0)
HCT: 34.9 % — ABNORMAL LOW (ref 36.0–46.0)
Hemoglobin: 11.6 g/dL — ABNORMAL LOW (ref 12.0–15.0)
Hemoglobin: 11.8 g/dL — ABNORMAL LOW (ref 12.0–15.0)
Hemoglobin: 11.4 g/dL — ABNORMAL LOW (ref 12.0–15.0)
Hemoglobin: 10.7 g/dL — ABNORMAL LOW (ref 12.0–15.0)
MCH: 26.6 pg (ref 26.0–34.0)
MCH: 26.8 pg (ref 26.0–34.0)
MCH: 26.5 pg (ref 26.0–34.0)
MCH: 26.4 pg (ref 26.0–34.0)
MCHC: 31.4 g/dL (ref 30.0–36.0)
MCHC: 31.7 g/dL (ref 30.0–36.0)
MCHC: 31.7 g/dL (ref 30.0–36.0)
MCHC: 30.7 g/dL (ref 30.0–36.0)
MCV: 84.6 fL (ref 80.0–100.0)
MCV: 84.4 fL (ref 80.0–100.0)
MCV: 83.7 fL (ref 80.0–100.0)
MCV: 86 fL (ref 80.0–100.0)
Platelets: 259 10*3/uL (ref 150–400)
Platelets: 252 10*3/uL (ref 150–400)
Platelets: 256 10*3/uL (ref 150–400)
Platelets: 259 10*3/uL (ref 150–400)
RBC: 4.36 MIL/uL (ref 3.87–5.11)
RBC: 4.41 MIL/uL (ref 3.87–5.11)
RBC: 4.3 MIL/uL (ref 3.87–5.11)
RBC: 4.06 MIL/uL (ref 3.87–5.11)
RDW: 13.9 % (ref 11.5–15.5)
RDW: 13.9 % (ref 11.5–15.5)
RDW: 14.2 % (ref 11.5–15.5)
RDW: 13.9 % (ref 11.5–15.5)
WBC: 13.3 10*3/uL — ABNORMAL HIGH (ref 4.0–10.5)
WBC: 12.3 10*3/uL — ABNORMAL HIGH (ref 4.0–10.5)
WBC: 11.3 10*3/uL — ABNORMAL HIGH (ref 4.0–10.5)
WBC: 16.2 10*3/uL — ABNORMAL HIGH (ref 4.0–10.5)
nRBC: 0 % (ref 0.0–0.2)
nRBC: 0 % (ref 0.0–0.2)
nRBC: 0 % (ref 0.0–0.2)
nRBC: 0 % (ref 0.0–0.2)

## 2019-02-07 LAB — GLUCOSE, CAPILLARY
Glucose-Capillary: 122 mg/dL — ABNORMAL HIGH (ref 70–99)
Glucose-Capillary: 115 mg/dL — ABNORMAL HIGH (ref 70–99)
Glucose-Capillary: 102 mg/dL — ABNORMAL HIGH (ref 70–99)
Glucose-Capillary: 134 mg/dL — ABNORMAL HIGH (ref 70–99)
Glucose-Capillary: 144 mg/dL — ABNORMAL HIGH (ref 70–99)

## 2019-02-07 LAB — MAGNESIUM
Magnesium: 5.6 mg/dL — ABNORMAL HIGH (ref 1.7–2.4)
Magnesium: 5.5 mg/dL — ABNORMAL HIGH (ref 1.7–2.4)

## 2019-02-07 MED ORDER — DIPHENHYDRAMINE HCL 50 MG/ML IJ SOLN
25.0000 mg | Freq: Once | INTRAMUSCULAR | Status: AC
  Administered 2019-02-07: 25 mg via INTRAVENOUS
  Filled 2019-02-07: qty 1

## 2019-02-07 MED ORDER — DEXAMETHASONE SODIUM PHOSPHATE 10 MG/ML IJ SOLN
10.0000 mg | Freq: Once | INTRAMUSCULAR | Status: AC
  Administered 2019-02-07: 10 mg via INTRAVENOUS
  Filled 2019-02-07: qty 1

## 2019-02-07 MED ORDER — CEFAZOLIN SODIUM-DEXTROSE 2-4 GM/100ML-% IV SOLN
2.0000 g | Freq: Once | INTRAVENOUS | Status: AC
  Administered 2019-02-07: 2 g via INTRAVENOUS
  Filled 2019-02-07: qty 100

## 2019-02-07 MED ORDER — TRANEXAMIC ACID-NACL 1000-0.7 MG/100ML-% IV SOLN: 1000.0000 mg | INTRAVENOUS | Status: AC

## 2019-02-07 MED ORDER — DIPHENHYDRAMINE HCL 50 MG/ML IJ SOLN: 12.5000 mg | INTRAMUSCULAR | Status: DC | PRN

## 2019-02-07 MED ORDER — MISOPROSTOL 200 MCG PO TABS
1000.0000 ug | ORAL_TABLET | Freq: Once | ORAL | Status: AC
  Administered 2019-02-07: 22:00:00 1000 ug via RECTAL

## 2019-02-07 MED ORDER — SODIUM CHLORIDE (PF) 0.9 % IJ SOLN
INTRAMUSCULAR | Status: DC | PRN
  Administered 2019-02-07: 12 mL/h via EPIDURAL

## 2019-02-07 MED ORDER — PHENYLEPHRINE 40 MCG/ML (10ML) SYRINGE FOR IV PUSH (FOR BLOOD PRESSURE SUPPORT): 80.0000 ug | PREFILLED_SYRINGE | INTRAVENOUS | Status: DC | PRN

## 2019-02-07 MED ORDER — METOCLOPRAMIDE HCL 5 MG/ML IJ SOLN
10.0000 mg | Freq: Once | INTRAMUSCULAR | Status: DC
  Filled 2019-02-07: qty 2

## 2019-02-07 MED ORDER — MISOPROSTOL 200 MCG PO TABS
ORAL_TABLET | ORAL | Status: AC
  Filled 2019-02-07: qty 5

## 2019-02-07 MED ORDER — EPHEDRINE 5 MG/ML INJ: 10.0000 mg | INTRAVENOUS | Status: DC | PRN

## 2019-02-07 MED ORDER — SODIUM CHLORIDE 0.9 % IV BOLUS: 250.0000 mL | Freq: Once | INTRAVENOUS | Status: DC

## 2019-02-07 MED ORDER — TRANEXAMIC ACID-NACL 1000-0.7 MG/100ML-% IV SOLN
INTRAVENOUS | Status: AC
  Administered 2019-02-07: 1000 mg
  Filled 2019-02-07: qty 100

## 2019-02-07 MED ORDER — FENTANYL-BUPIVACAINE-NACL 0.5-0.125-0.9 MG/250ML-% EP SOLN
12.0000 mL/h | EPIDURAL | Status: DC | PRN
  Administered 2019-02-07: 12 mL/h via EPIDURAL
  Filled 2019-02-07 (×2): qty 250

## 2019-02-07 MED ORDER — LACTATED RINGERS IV SOLN
500.0000 mL | Freq: Once | INTRAVENOUS | Status: AC
  Administered 2019-02-07: 500 mL via INTRAVENOUS

## 2019-02-07 MED ORDER — LIDOCAINE HCL (PF) 1 % IJ SOLN
INTRAMUSCULAR | Status: DC | PRN
  Administered 2019-02-07: 5 mL via EPIDURAL

## 2019-02-07 NOTE — Anesthesia Preprocedure Evaluation (Addendum)
Anesthesia Evaluation  Patient identified by MRN, date of birth, ID band Patient awake    Reviewed: Allergy & Precautions, NPO status , Patient's Chart, lab work & pertinent test results  Airway Mallampati: II  TM Distance: >3 FB Neck ROM: Full    Dental no notable dental hx. (+) Teeth Intact   Pulmonary neg pulmonary ROS,    Pulmonary exam normal breath sounds clear to auscultation       Cardiovascular hypertension, Pt. on home beta blockers and Pt. on medications Normal cardiovascular exam Rhythm:Regular Rate:Normal  Chronic Htn Pre eclampsia   Neuro/Psych Anxiety negative neurological ROS     GI/Hepatic negative GI ROS, Neg liver ROS,   Endo/Other  diabetes, Type 2  Renal/GU negative Renal ROS     Musculoskeletal negative musculoskeletal ROS (+)   Abdominal (+) + obese,   Peds  Hematology Hgb11.6 Plt 259   Anesthesia Other Findings   Reproductive/Obstetrics (+) Pregnancy                            Anesthesia Physical Anesthesia Plan  ASA: III  Anesthesia Plan: Epidural   Post-op Pain Management:    Induction:   PONV Risk Score and Plan:   Airway Management Planned:   Additional Equipment:   Intra-op Plan:   Post-operative Plan:   Informed Consent: I have reviewed the patients History and Physical, chart, labs and discussed the procedure including the risks, benefits and alternatives for the proposed anesthesia with the patient or authorized representative who has indicated his/her understanding and acceptance.       Plan Discussed with:   Anesthesia Plan Comments: (34.6 wk G6P3 w DM2 preeclampsia and chronic Htn for LEA)       Anesthesia Quick Evaluation

## 2019-02-07 NOTE — Anesthesia Procedure Notes (Signed)
Epidural Patient location during procedure: OB Start time: 02/07/2019 2:28 AM End time: 02/07/2019 2:40 AM  Staffing Anesthesiologist: Barnet Glasgow, MD Performed: anesthesiologist   Preanesthetic Checklist Completed: patient identified, site marked, surgical consent, pre-op evaluation, timeout performed, IV checked, risks and benefits discussed and monitors and equipment checked  Epidural Patient position: sitting Prep: site prepped and draped and DuraPrep Patient monitoring: continuous pulse ox and blood pressure Approach: midline Location: L3-L4 Injection technique: LOR air  Needle:  Needle type: Tuohy  Needle gauge: 17 G Needle length: 9 cm and 9 Needle insertion depth: 8 cm Catheter type: closed end flexible Catheter size: 19 Gauge Catheter at skin depth: 13 cm Test dose: negative  Assessment Events: blood not aspirated, injection not painful, no injection resistance, negative IV test and no paresthesia  Additional Notes Patient identified. Risks/Benefits/Options discussed with patient including but not limited to bleeding, infection, nerve damage, paralysis, failed block, incomplete pain control, headache, blood pressure changes, nausea, vomiting, reactions to medication both or allergic, itching and postpartum back pain. Confirmed with bedside nurse the patient's most recent platelet count. Confirmed with patient that they are not currently taking any anticoagulation, have any bleeding history or any family history of bleeding disorders. Patient expressed understanding and wished to proceed. All questions were answered. Sterile technique was used throughout the entire procedure. Please see nursing notes for vital signs. Test dose was given through epidural needle and negative prior to continuing to dose epidural or start infusion. Warning signs of high block given to the patient including shortness of breath, tingling/numbness in hands, complete motor block, or any  concerning symptoms with instructions to call for help. Patient was given instructions on fall risk and not to get out of bed. All questions and concerns addressed with instructions to call with any issues. 1 Attempt (S) . Patient tolerated procedure well.

## 2019-02-07 NOTE — Progress Notes (Signed)
LABOR PROGRESS NOTE  Summer Padilla is a 38 y.o. [id number] at [redacted]w[redacted]d  admitted for induction of labor due to chronic hypertension with superimposed preeclampsia with severe features (headache).  Subjective: Patient is doing well.  She is still having a headache but she says that it is now at a 4 from a 6 previously on the pain scale.  Patient says epidural is still working well  Objective: BP 140/86   Pulse (!) 101   Temp 98 F (36.7 C) (Oral)   Resp (!) 22   Ht 5\' 1"  (1.549 m)   Wt 102.9 kg   LMP 05/15/2018   SpO2 98%   BMI 42.84 kg/m  or  Vitals:   02/07/19 1231 02/07/19 1258 02/07/19 1301 02/07/19 1331  BP: (!) 124/59  117/70 140/86  Pulse: 90  96 (!) 101  Resp:  (!) 22    Temp:      TempSrc:      SpO2:      Weight:      Height:         Dilation: 4.5 Effacement (%): 50 Cervical Position: Middle Station: Ballotable Presentation: Vertex Exam by:: [employer] FHT: baseline rate 150,  Moderate varibility, + acel, no decel Toco: unsure  Labs: Lab Results  Component Value Date   WBC 11.3 (H) 02/07/2019   HGB 11.4 (L) 02/07/2019   HCT 36.0 02/07/2019   MCV 83.7 02/07/2019   PLT 256 02/07/2019    Patient Active Problem List   Diagnosis Date Noted  . Gestational diabetes mellitus (GDM) 01/18/2019  . Chronic hypertension with superimposed severe preeclampsia 10/25/2018  . Anxiety 10/25/2018  . AMA (advanced maternal age) multigravida 35+ 08/09/2018  . History of eclampsia 08/09/2018  . History of gestational diabetes in prior pregnancy, currently pregnant 08/09/2018  . Severe obesity (BMI 35.0-39.9) with comorbidity ([place]) 12/30/2017  . Supervision of high risk pregnancy, antepartum 08/03/2012  . History of cesarean delivery, currently pregnant 07/27/2012    Assessment / Plan: 38 y.o. [id number] at [redacted]w[redacted]d here for induction of labor due to chronic hypertension with superimposed preeclampsia with severe features (headache).  Labor: Progressing, will increase Pitocin.   Will recheck in 2 hours Fetal Wellbeing: Category 1, reassuring Pain Control: Epidural in place Anticipated MOD: Vaginal delivery  Superimposed pre-E with severe features (headache) -We will give patient headache cocktail but not include Reglan.  Patient reports she received a medication for headache in the past and it made her jittery and uncomfortable -Headache cocktail took headache from 6 to a 4 on the pain scale -Continue to monitor   Gifford Shave, MD  PGY-1, [facility] 02/07/2019, 1:47 PM

## 2019-02-07 NOTE — Progress Notes (Signed)
LABOR PROGRESS NOTE  Summer Hernandezis a 63 y.K.G4W1027 at [redacted]w[redacted]d admitted for induction of labor due to chronic hypertension with superimposed preeclampsia with severe features (headache).  Subjective: Patient is doing well, resting comfortably.  She still has a headache.  Be rechecked to assess for engagement of baby to possibly rupture membrane but baby is not engaged.  Objective: BP 138/83   Pulse 92   Temp 98 F (36.7 C) (Oral)   Resp 18   Ht 5\' 1"  (1.549 m)   Wt 102.9 kg   LMP 05/15/2018   SpO2 98%   BMI 42.84 kg/m  or  Vitals:   02/07/19 1357 02/07/19 1401 02/07/19 1430 02/07/19 1501  BP:  133/74 (!) 147/78 138/83  Pulse:  94 92 92  Resp: (!) 22   18  Temp:      TempSrc:      SpO2:      Weight:      Height:         Dilation: 5 Effacement (%): 50 Cervical Position: Middle Station: Ballotable Presentation: Vertex Exam by:: Qunisha Bryk(via bedside utrasound) FHT: baseline rate 145, moderate varibility,  + acel, occasional late decel Toco: Every 9 minutes  Labs: Lab Results  Component Value Date   WBC 11.3 (H) 02/07/2019   HGB 11.4 (L) 02/07/2019   HCT 36.0 02/07/2019   MCV 83.7 02/07/2019   PLT 256 02/07/2019    Patient Active Problem List   Diagnosis Date Noted  . Gestational diabetes mellitus (GDM) 01/18/2019  . Chronic hypertension with superimposed severe preeclampsia 10/25/2018  . Anxiety 10/25/2018  . AMA (advanced maternal age) multigravida 35+ 08/09/2018  . History of eclampsia 08/09/2018  . History of gestational diabetes in prior pregnancy, currently pregnant 08/09/2018  . Severe obesity (BMI 35.0-39.9) with comorbidity ([place]) 12/30/2017  . Supervision of high risk pregnancy, antepartum 08/03/2012  . History of cesarean delivery, currently pregnant 07/27/2012    Assessment / Plan: 38 y.O.[id number] at [redacted]w[redacted]d here for induction of labor due to chronic hypertension with superimposed preeclampsia with severe features  (headache).  Labor:Progressing, baby is ballotable at this time so unable to rupture membranes.  Will reassess in 2 hours  Fetal Wellbeing:Category 1, reassuring Pain Control:Epidural in place Anticipated HKV:[id number] delivery  Superimposed pre-E with severe features (headache) -We will give patient headache cocktail but not include Reglan. Patient reports she received a medication for headache in the past and it made her jittery and uncomfortable -Headache cocktail took headache from 6 to a 4 on the pain scale -Continue to monitor   Gifford Shave, MD  PGY-1, [facility] 02/07/2019, 3:42 PM

## 2019-02-07 NOTE — Progress Notes (Signed)
Patient c/o new onset epigastric pain

## 2019-02-07 NOTE — Discharge Summary (Signed)
Postpartum Discharge Summary  Date of Service updated 02/09/19     Patient Name: Summer Padilla DOB: 1981/02/29 MRN: [id number]  Date of admission: 02/05/2019 Delivering Provider: Merilyn Baba   Date of discharge: 02/09/2019  Admitting diagnosis: headache pelvic pain nausea and pain under l breast  Intrauterine pregnancy: [redacted]w[redacted]d    Secondary diagnosis:  Principal Problem:   Chronic hypertension with superimposed severe preeclampsia Active Problems:   History of cesarean delivery, currently pregnant   Supervision of high risk pregnancy, antepartum   AMA (advanced maternal age) multigravida 35+   Severe obesity (BMI 35.0-39.9) with comorbidity ([place]   History of eclampsia   Gestational diabetes mellitus (GDM)  Additional problems: Pre-eclampsia with severe features (intractable headache and RUQ pain)     Discharge diagnosis: Preterm Pregnancy Delivered, Preeclampsia (severe), CHTN, GDM A2 and PPH                                                                                                Post partum procedures:Magnesium x 24 hrs  Augmentation: AROM, Pitocin and Foley Balloon  Complications: PPH 8409(s/p 1000 mcg cytotec PR, TXA, manual sweep)  Hospital course:  Induction of Labor With Vaginal Delivery   38y.o. yo [id number] admitted to the hospital 02/05/2019 for induction of labor.  Indication for induction: Preeclampsia with severe features (Headache and subsequent development of RUQ pain).  Patient had a labor course significant for presentation to [facility]U with intractable headache, Pre-E with severe features. She was started on magnesium. FB was placed, and labor was augmented with pitocin. AROM was also performed, and she progressed to complete after several position changes then rapidly delivered a baby girl: Membrane Rupture Time/Date: 4:55 PM ,02/07/2019   Intrapartum Procedures: Episiotomy:                                          Lacerations:   Periurethral [8]  Patient had delivery of a Viable infant.  Information for the patient's newborn:  HeLachlyn, Vanderstelt0[[id number]]Delivery Method: Vaginal, Spontaneous(Filed from Delivery Summary)    02/07/2019  Details of delivery can be found in separate delivery note.  Approximately 2 hours after delivery patient had a syncopal event.  Pt had not further syncopal events during her hospitalization. She received magnesium x 24 hrs. BP's stable 120-130's/70's. No HA or visual changes. She did experience a nose bleed which was treated and resolved with resolved with Afrin.  She processed ambulating, voiding, tolerating diet and good pain control. Felt amendable for discharge home on PPD # 2. Discharge medications, instructions and follow up reviewed with pt. Pt verbalized understanding.  Patient is discharged home 02/09/19. Delivery time: 9:56 PM    Magnesium Sulfate received: Yes BMZ received: Yes Rhophylac:N/A MMR:N/A Transfusion:No  Physical exam  Vitals:   02/09/19 0018 02/09/19 0139 02/09/19 0435 02/09/19 0910  BP: 134/75 134/76 123/75 125/65  Pulse: 94 92 83 77  Resp: 16 16 18 18   Temp:  98.1 F (36.7 C) 98.2 F (36.8 C)  TempSrc:   Oral Oral  SpO2: 100%  100% 100%  Weight:      Height:       General: alert Lochia: appropriate Uterine Fundus: firm Incision: Healing well with no significant drainage DVT Evaluation: No evidence of DVT seen on physical exam. Labs: Lab Results  Component Value Date   WBC 13.2 (H) 02/08/2019   HGB 9.1 (L) 02/08/2019   HCT 28.1 (L) 02/08/2019   MCV 82.6 02/08/2019   PLT 216 02/08/2019   CMP Latest Ref Rng & Units 02/08/2019  Glucose 70 - 99 mg/dL 182(H)  BUN 6 - 20 mg/dL 9  Creatinine 0.44 - 1.00 mg/dL 0.76  Sodium 135 - 145 mmol/L 129(L)  Potassium 3.5 - 5.1 mmol/L 3.8  Chloride 98 - 111 mmol/L 102  CO2 22 - 32 mmol/L 14(L)  Calcium 8.9 - 10.3 mg/dL 6.6(L)  Total Protein 6.5 - 8.1 g/dL 5.6(L)  Total Bilirubin 0.3 -  1.2 mg/dL 0.8  Alkaline Phos 38 - 126 U/L 91  AST 15 - 41 U/L 44(H)  ALT 0 - 44 U/L 35    Discharge instruction: per After Visit Summary and "Baby and Me Booklet".  After visit meds:  Allergies as of 02/09/2019   No Known Allergies     Medication List    STOP taking these medications   acetaminophen 325 MG tablet Commonly known as: TYLENOL   aspirin EC 81 MG tablet   blood glucose meter kit and supplies Kit   Butalbital-APAP-Caffeine 50-325-40 MG capsule   Comfort Fit Maternity Supp Med Misc   cyclobenzaprine 10 MG tablet Commonly known as: FLEXERIL   docusate sodium 100 MG capsule Commonly known as: COLACE   escitalopram 10 MG tablet Commonly known as: LEXAPRO   Glucophage 500 MG tablet Generic drug: metFORMIN   glucose blood test strip   hydrOXYzine 25 MG capsule Commonly known as: Vistaril   lidocaine 5 % ointment Commonly known as: XYLOCAINE   Microlet Lancets Misc   Misc. Devices Misc   polyethylene glycol powder 17 GM/SCOOP powder Commonly known as: GLYCOLAX/MIRALAX     TAKE these medications   famotidine 20 MG tablet Commonly known as: Pepcid Take 1 tablet (20 mg total) by mouth 2 (two) times daily as needed for heartburn or indigestion.   ferrous sulfate 325 (65 FE) MG tablet Take 1 tablet (325 mg total) by mouth 2 (two) times daily with a meal.   ibuprofen 600 MG tablet Commonly known as: ADVIL Take 1 tablet (600 mg total) by mouth 3 (three) times daily.   oxyCODONE 5 MG immediate release tablet Commonly known as: Oxy IR/ROXICODONE Take 1 tablet (5 mg total) by mouth every 4 (four) hours as needed (pain scale 4-7).   prenatal multivitamin Tabs tablet Take 1 tablet by mouth every other day.       Diet: routine diet  Activity: Advance as tolerated. Pelvic rest for 6 weeks.   Outpatient follow up:4 weeks Follow up Appt:No future appointments. Follow up Visit: Please schedule this patient for Postpartum visit in: 4 weeks with  the following provider: MD For C/S patients schedule nurse incision check in weeks 2 weeks: no High risk pregnancy complicated by: cHTN, superimposed Pre-E w/ SF (HA and RUQ pain) Delivery mode:  vaginal delivery Anticipated Birth Control:  vasectomy PP Procedures needed: BP check  Schedule Integrated [place] visit: no   Newborn Data: See medical records  Newborn Delivery   Birth  date/time: 02/07/2019 21:56:00 Delivery type: Vaginal, Spontaneous      Baby Feeding: Breast Disposition:rooming in Contraception: FOB plans to get a vasectomy. Educated that for four months following the procedures, a second method of contraception should be used until repeat semen analysis comes back negative.  02/09/2019 Chancy Milroy, MD

## 2019-02-07 NOTE — Progress Notes (Addendum)
LABOR PROGRESS NOTE  Summer Padilla is a 38 y.o. K2I0973 at [redacted]w[redacted]d  admitted for induction of labor due to chronic hypertension with superimposed preeclampsia with severe features (headache).  Subjective: Patient is doing well but complaining of headache at this time.  She says that the medication she is received so far have helped with the headache but as soon as they wear off the headache comes back.  Patient reports the epidural is working well and she has very little pain.  Objective: BP 134/72   Pulse 90   Temp 98 F (36.7 C) (Oral)   Resp 20   Ht 5\' 1"  (1.549 m)   Wt 102.9 kg   LMP 05/15/2018   SpO2 98%   BMI 42.84 kg/m  or  Vitals:   02/07/19 1031 02/07/19 1100 02/07/19 1101 02/07/19 1130  BP: (!) 147/73  139/84 134/72  Pulse: 94  (!) 102 90  Resp:  20    Temp:  98 F (36.7 C)    TempSrc:  Oral    SpO2:      Weight:      Height:         Dilation: 4 Effacement (%): 50 Cervical Position: Middle Station: -2 Presentation: Vertex Exam by:: Lezli Danek FHT: baseline rate 145, moderate varibility, Positive acel, occasional variable decel Toco: Unclear due to poor tracing.  Appear irregular  Labs: Lab Results  Component Value Date   WBC 11.3 (H) 02/07/2019   HGB 11.4 (L) 02/07/2019   HCT 36.0 02/07/2019   MCV 83.7 02/07/2019   PLT 256 02/07/2019    Patient Active Problem List   Diagnosis Date Noted  . Gestational diabetes mellitus (GDM) 01/18/2019  . Chronic hypertension with superimposed severe preeclampsia 10/25/2018  . Anxiety 10/25/2018  . AMA (advanced maternal age) multigravida 35+ 08/09/2018  . History of eclampsia 08/09/2018  . History of gestational diabetes in prior pregnancy, currently pregnant 08/09/2018  . Severe obesity (BMI 35.0-39.9) with comorbidity ([place]) 12/30/2017  . Supervision of high risk pregnancy, antepartum 08/03/2012  . History of cesarean delivery, currently pregnant 07/27/2012    Assessment / Plan: 38 y.o. [id number] at [redacted]w[redacted]d here  for induction of labor due to chronic hypertension with superimposed preeclampsia with severe features (headache).  Labor: Progressing, will increase Pitocin.  Will recheck in 4 hours Fetal Wellbeing: Category 1, reassuring Pain Control: Epidural in place Anticipated MOD: Vaginal delivery  Superimposed pre-ED with severe features (headache) -We will give patient headache cocktail but not include Reglan.  Patient reports she received a medication for headache in the past and it made her jittery and uncomfortable -Repeat CBC, CMP, mag levels -Continue to monitor   Gifford Shave, MD  PGY-1, [facility] 02/07/2019, 11:31 AM

## 2019-02-07 NOTE — Progress Notes (Signed)
Faculty Note  S: In to check patient, she is feeling well, comfortable with contractions. Headache slightly improved, new onset epigastric pain.  O: BP (!) 152/81   Pulse 83   Temp 98 F (36.7 C) (Oral)   Resp 20   Ht 5\' 1"  (1.549 m)   Wt 102.9 kg   LMP 05/15/2018   SpO2 98%   BMI 42.84 kg/m   Gen: alert, oriented SVE: 5/50/-2  FHT: 150s bpm, moderate variability, accels absent, decels absents Toco: ctx every 4-5 min   A/P: Pt is 38 y.o. [id number] @ [redacted]w[redacted]d who is admitted for induction of labor for chronic hypertension with superimposed pre-eclampsia with severe features. On magnesium, headache is slightly better. In to assess patient as she has previously been ballotable, pitocin at max and patient is having recurrent late decels with last several contractions. Overall, strip has moderate variability with no accels, overall appears reassuring. Reviewed inability to further augment patient given history of CS and fetal tracing, options are to AROM although usually wait until head has descended somewhat more to do so, or do pitocin break. Reviewed I feel it is safe to AROM her as I can palpate no cord surrounding head, although there is risk it may present with AROM. Reviewed risks/benefits of each, including not moving towards labor with pitocin break, needing an emergent c-section if cord prolapse. Answered all questions. She would like to proceed with AROM. Reviewed she will need repeat c-section with non-reassuring fetal tracing. She verbalizes understanding of the above. AROM performed with amniohook, copious amount blood tinged fluid from cervix, fetal head settled onto cervix with no cord palpated.   Cont mag Cont pitocin Will recheck LFT now for new onset epigastric pain    K. Arvilla Meres, M.D. Attending Center for [employer] manager)

## 2019-02-07 NOTE — Progress Notes (Addendum)
Summer Padilla is a 38 y.o. [id number] at [redacted]w[redacted]d admitted for induction of labor due to Pre-eclamptic toxemia of pregnancy with severe features (Headache).  Subjective: Feeling much better after epidural and being able to sleep for several hours.  Objective: BP (!) 146/84   Pulse 94   Temp 98 F (36.7 C) (Oral)   Resp 18   Ht 5\' 1"  (1.549 m)   Wt 102.9 kg   LMP 05/15/2018   SpO2 98%   BMI 42.84 kg/m  No intake/output data recorded.  FHT:  FHR: 140 bpm, variability: moderate,  accelerations:  Present,  decelerations:  Absent UC:   irregular, every 5-7 minutes  SVE:   Dilation: 4 Effacement (%): 50 Station: -3 Exam by:: Dr. Darene Lamer  Pitocin @ 3 mu/min  Labs: Lab Results  Component Value Date   WBC 12.3 (H) 02/07/2019   HGB 11.8 (L) 02/07/2019   HCT 37.2 02/07/2019   MCV 84.4 02/07/2019   PLT 252 02/07/2019    Assessment / Plan: 89 y.H.T3S2876 at [redacted]w[redacted]d here for IOL for severe Pre-E. On Mg++. Symptomatic with headache. BPs stable.  Labor:TOLAC, FB out at 1230. Pit at 3, making good cervical change. AROM as appropriate Fetal Wellbeing:Category I Pain Control:epidural Pre-eclampsia:still has headache. On mag, BPs not in severe range. No other SxS I/D:GBS neg Anticipated OTL:XBWIOMB delivery, CS as appropriate Gestational Diabetes: Sliding scale with CBGs q 4 h. Last CBG 115.  Merilyn Baba DO OB Fellow, Faculty Practice 02/07/2019, 7:13 AM

## 2019-02-07 NOTE — Progress Notes (Signed)
Summer Padilla is a 38 y.o. A1P3790 at [redacted]w[redacted]d admitted for induction of labor due to Pre-eclamptic toxemia of pregnancy with severe features (Headache).  Subjective: FB out at 1230, feeling more contractions. Would like epidural prior to starting pitocin as her contractions are becoming more painful.  Objective: BP 139/76   Pulse 92   Temp 97.9 F (36.6 C) (Oral)   Resp 19   Ht 5\' 1"  (1.549 m)   Wt 102.9 kg   LMP 05/15/2018   SpO2 98%   BMI 42.84 kg/m  Total I/O In: -  Out: 1950 [Urine:1950]  FHT:  FHR: 145 bpm, variability: moderate,  accelerations:  Present,  decelerations:  Present (one late deceleration) UC:   irregular, every 10-12 minutes  SVE:   Dilation: 2 Effacement (%): 20 Station: Ballotable Exam by:: Chukwuemeka, S. RNC  Labs: Lab Results  Component Value Date   WBC 13.3 (H) 02/07/2019   HGB 11.6 (L) 02/07/2019   HCT 36.9 02/07/2019   MCV 84.6 02/07/2019   PLT 259 02/07/2019    Assessment / Plan: 52 y.W.I0X7353 at [redacted]w[redacted]d here for IOL for severe Pre-E. On Mg++. Symptomatic with headache. BPs stable.   Labor: TOLAC, FB out at 1230. Plan to start pitocinafter epidural placed Fetal Wellbeing:  Category II for one late decel, reassuring for moderate variability and positive accels. Pain Control:  epidural Pre-eclampsia: still has headache. On mag, BPs not in severe range. No other SxS I/D:  GBS neg Anticipated MOD:  vaginal delivery, CS as appropriate  Gestational Diabetes: Sliding scale with CBGs q 4 h. Last CBG 122.   Merilyn Baba DO OB Fellow, Faculty Practice 02/07/2019, 2:30 AM

## 2019-02-07 NOTE — Progress Notes (Signed)
LABOR PROGRESS NOTE  Summer Padilla is a 38 y.o. L2G4010 at [redacted]w[redacted]d  admitted for induction of labor because of chronic hypertension with superimposed preeclampsia with severe features (headache)  Subjective: Patient reports she is feeling more pressure from her contractions.  She is otherwise doing well but is exhausted.  She still has a headache.  Objective: BP (!) 166/88   Pulse 92   Temp (!) 97.5 F (36.4 C) (Axillary)   Resp (P) 20   Ht 5\' 1"  (1.549 m)   Wt 102.9 kg   LMP 05/15/2018   SpO2 98%   BMI 42.84 kg/m  or  Vitals:   02/07/19 1730 02/07/19 1801 02/07/19 1826 02/07/19 1831  BP: (!) 153/90 (!) 157/95  (!) 166/88  Pulse: 95 99  92  Resp:  20 (P) 20   Temp:      TempSrc:      SpO2:      Weight:      Height:        On cervical check patient has dilated more from previous checks.  When I go to remove my hand I feel more fluid rushing around hand.  I checked to make sure there is no cord coming through and I am unable to feel 1.  I slowly relax my baby's head come down. Dilation: (P) 7.5 Effacement (%): (P) 50 Cervical Position: (P) Middle Station: Ballotable Presentation: (P) Vertex Exam by:: (P) Tauni Sanks FHT: baseline rate 145, moderate varibility, no acel, no decel Toco: Every 6 minutes   Labs: Lab Results  Component Value Date   WBC 11.3 (H) 02/07/2019   HGB 11.4 (L) 02/07/2019   HCT 36.0 02/07/2019   MCV 83.7 02/07/2019   PLT 256 02/07/2019    Patient Active Problem List   Diagnosis Date Noted  . Gestational diabetes mellitus (GDM) 01/18/2019  . Chronic hypertension with superimposed severe preeclampsia 10/25/2018  . Anxiety 10/25/2018  . AMA (advanced maternal age) multigravida 35+ 08/09/2018  . History of eclampsia 08/09/2018  . History of gestational diabetes in prior pregnancy, currently pregnant 08/09/2018  . Severe obesity (BMI 35.0-39.9) with comorbidity ([place]) 12/30/2017  . Supervision of high risk pregnancy, antepartum 08/03/2012  .  History of cesarean delivery, currently pregnant 07/27/2012    Assessment / Plan: 57 y.U.V2Z3664 at [redacted]w[redacted]d here for induction of labor due to chronic hypertension with superimposed preeclampsia with severe features (headache).  Labor:Progressing,  on cervical check and noticed a large gush of fluid every time he went to back out.  Slowly came out while putting baby's head down.  Check for cords and did not identify 1. Will reassess in 2 hours.  Consider IUPC at next check. Fetal Wellbeing:Category 1, reassuring Pain Control:Epidural in place Anticipated QIH:KVQQVZD delivery  Superimposed pre-E with severe features (headache) -Patient currently on mag.  Her headache is slightly better than earlier in the day but is still present. -Continue to monitor for worsening severe features  Gifford Shave, MD  PGY-1, [facility] 02/07/2019, 6:36 PM

## 2019-02-07 NOTE — Progress Notes (Signed)
Summer Padilla is a 38 y.o. I3K7425 at [redacted]w[redacted]d admitted for induction of labor due to Pre-eclamptic toxemia of pregnancy with severe features (headache and now RUQ pain).  Subjective: Continues to have headache and RUQ pain, feeling contractions in her back.  Objective: BP 132/67   Pulse 87   Temp 97.7 F (36.5 C) (Axillary)   Resp 20   Ht 5\' 1"  (1.549 m)   Wt 102.9 kg   LMP 05/15/2018   SpO2 98%   BMI 42.84 kg/m  Total I/O In: 338.6 [P.O.:110; I.V.:128.6; IV Piggyback:100] Out: 300 [Urine:300]  FHT:  FHR: 145 bpm, variability: moderate,  accelerations:  Abscent,  decelerations:  Present one variable, occasional shallow/subtle decels that resolve back to baseline UC:   irregular, every 3-6 minutes  SVE:   Dilation: 7.5 Effacement (%): 50 Station: Ballotable Exam by:: Cresenzo  Pitocin @ 19 mu/min  Labs: Lab Results  Component Value Date   WBC 11.3 (H) 02/07/2019   HGB 11.4 (L) 02/07/2019   HCT 36.0 02/07/2019   MCV 83.7 02/07/2019   PLT 256 02/07/2019    Assessment / Plan: 58 y.Z.D6L8756 at [redacted]w[redacted]d here for IOL for severe Pre-E. On Mg++. Symptomatic with headache, RUQ pain. BPs stable.  Labor:TOLAC, s/p FB and AROM. Pit at 19, making good cervical change.  Fetal Wellbeing:Category II, reassuring for moderate variability. Pain Control:epidural Pre-eclampsia:still has headache, RUQ pain. On mag, mag level ordered, mild increase in transaminases on CMP, but not significantly high. BPs higher but not currently in severe range. No other SxS. I/D:GBS neg Anticipated EPP:IRJJOAC delivery, CS as appropriate Gestational Diabetes: Sliding scale with CBGs q 4 h. Last CBG 144 which required insulin, if persistently above 144 then will have to initiate glucose stabilizer.  [place] Fellow, Faculty Practice 02/07/2019, 8:44 PM

## 2019-02-08 ENCOUNTER — Encounter: Payer: Self-pay | Admitting: *Deleted

## 2019-02-08 LAB — COMPREHENSIVE METABOLIC PANEL
ALT: 35 U/L (ref 0–44)
AST: 44 U/L — ABNORMAL HIGH (ref 15–41)
Albumin: 2.4 g/dL — ABNORMAL LOW (ref 3.5–5.0)
Alkaline Phosphatase: 91 U/L (ref 38–126)
Anion gap: 13 (ref 5–15)
BUN: 9 mg/dL (ref 6–20)
CO2: 14 mmol/L — ABNORMAL LOW (ref 22–32)
Calcium: 6.6 mg/dL — ABNORMAL LOW (ref 8.9–10.3)
Chloride: 102 mmol/L (ref 98–111)
Creatinine, Ser: 0.76 mg/dL (ref 0.44–1.00)
GFR calc Af Amer: >60 mL/min (ref >60)
GFR calc non Af Amer: >60 mL/min (ref >60)
Glucose, Bld: 182 mg/dL — ABNORMAL HIGH (ref 70–99)
Potassium: 3.8 mmol/L (ref 3.5–5.1)
Sodium: 129 mmol/L — ABNORMAL LOW (ref 135–145)
Total Bilirubin: 0.8 mg/dL (ref 0.3–1.2)
Total Protein: 5.6 g/dL — ABNORMAL LOW (ref 6.5–8.1)

## 2019-02-08 LAB — CBC
HCT: 28.1 % — ABNORMAL LOW (ref 36.0–46.0)
HCT: 31.6 % — ABNORMAL LOW (ref 36.0–46.0)
Hemoglobin: 10.3 g/dL — ABNORMAL LOW (ref 12.0–15.0)
Hemoglobin: 9.1 g/dL — ABNORMAL LOW (ref 12.0–15.0)
MCH: 26.8 pg (ref 26.0–34.0)
MCH: 27.5 pg (ref 26.0–34.0)
MCHC: 32.4 g/dL (ref 30.0–36.0)
MCHC: 32.6 g/dL (ref 30.0–36.0)
MCV: 82.6 fL (ref 80.0–100.0)
MCV: 84.3 fL (ref 80.0–100.0)
Platelets: 216 10*3/uL (ref 150–400)
Platelets: 240 10*3/uL (ref 150–400)
RBC: 3.4 MIL/uL — ABNORMAL LOW (ref 3.87–5.11)
RBC: 3.75 MIL/uL — ABNORMAL LOW (ref 3.87–5.11)
RDW: 14 % (ref 11.5–15.5)
RDW: 14 % (ref 11.5–15.5)
WBC: 13.2 10*3/uL — ABNORMAL HIGH (ref 4.0–10.5)
WBC: 17.6 10*3/uL — ABNORMAL HIGH (ref 4.0–10.5)
nRBC: 0 % (ref 0.0–0.2)
nRBC: 0 % (ref 0.0–0.2)

## 2019-02-08 LAB — MAGNESIUM: Magnesium: 5.4 mg/dL — ABNORMAL HIGH (ref 1.7–2.4)

## 2019-02-08 LAB — GLUCOSE, CAPILLARY
Glucose-Capillary: 141 mg/dL — ABNORMAL HIGH (ref 70–99)
Glucose-Capillary: 151 mg/dL — ABNORMAL HIGH (ref 70–99)
Glucose-Capillary: 188 mg/dL — ABNORMAL HIGH (ref 70–99)

## 2019-02-08 MED ORDER — FERROUS SULFATE 325 (65 FE) MG PO TABS
325.0000 mg | ORAL_TABLET | Freq: Two times a day (BID) | ORAL | Status: DC
  Administered 2019-02-08 – 2019-02-09 (×2): 325 mg via ORAL
  Filled 2019-02-08 (×2): qty 1

## 2019-02-08 MED ORDER — OXYCODONE HCL 5 MG PO TABS: 10.0000 mg | ORAL_TABLET | ORAL | Status: DC | PRN

## 2019-02-08 MED ORDER — LACTATED RINGERS IV SOLN
INTRAVENOUS | Status: DC
  Administered 2019-02-08: 12:00:00 via INTRAVENOUS

## 2019-02-08 MED ORDER — ACETAMINOPHEN 325 MG PO TABS
650.0000 mg | ORAL_TABLET | ORAL | Status: DC | PRN
  Administered 2019-02-08 – 2019-02-09 (×4): 650 mg via ORAL
  Filled 2019-02-08 (×4): qty 2

## 2019-02-08 MED ORDER — IBUPROFEN 600 MG PO TABS
600.0000 mg | ORAL_TABLET | Freq: Four times a day (QID) | ORAL | Status: DC
  Administered 2019-02-08 (×4): 600 mg via ORAL
  Filled 2019-02-08 (×5): qty 1

## 2019-02-08 MED ORDER — ZOLPIDEM TARTRATE 5 MG PO TABS: 5.0000 mg | ORAL_TABLET | Freq: Every evening | ORAL | Status: DC | PRN

## 2019-02-08 MED ORDER — COCONUT OIL OIL: 1.0000 "application " | TOPICAL_OIL | Status: DC | PRN

## 2019-02-08 MED ORDER — ONDANSETRON HCL 4 MG/2ML IJ SOLN: 4.0000 mg | INTRAMUSCULAR | Status: DC | PRN

## 2019-02-08 MED ORDER — SIMETHICONE 80 MG PO CHEW: 80.0000 mg | CHEWABLE_TABLET | ORAL | Status: DC | PRN

## 2019-02-08 MED ORDER — OXYCODONE HCL 5 MG PO TABS
5.0000 mg | ORAL_TABLET | ORAL | Status: DC | PRN
  Administered 2019-02-08: 5 mg via ORAL
  Filled 2019-02-08: qty 1

## 2019-02-08 MED ORDER — PRENATAL MULTIVITAMIN CH
1.0000 | ORAL_TABLET | Freq: Every day | ORAL | Status: DC
  Administered 2019-02-08: 1 via ORAL
  Filled 2019-02-08: qty 1

## 2019-02-08 MED ORDER — OXYMETAZOLINE HCL 0.05 % NA SOLN
2.0000 | Freq: Two times a day (BID) | NASAL | Status: AC
  Administered 2019-02-09 (×2): 2 via NASAL
  Filled 2019-02-08: qty 30

## 2019-02-08 MED ORDER — SENNOSIDES-DOCUSATE SODIUM 8.6-50 MG PO TABS
2.0000 | ORAL_TABLET | ORAL | Status: DC
  Administered 2019-02-08 – 2019-02-09 (×2): 2 via ORAL
  Filled 2019-02-08 (×2): qty 2

## 2019-02-08 MED ORDER — ONDANSETRON HCL 4 MG PO TABS: 4.0000 mg | ORAL_TABLET | ORAL | Status: DC | PRN

## 2019-02-08 MED ORDER — BENZOCAINE-MENTHOL 20-0.5 % EX AERO: 1.0000 "application " | INHALATION_SPRAY | CUTANEOUS | Status: DC | PRN

## 2019-02-08 MED ORDER — DIBUCAINE (PERIANAL) 1 % EX OINT: 1.0000 "application " | TOPICAL_OINTMENT | CUTANEOUS | Status: DC | PRN

## 2019-02-08 MED ORDER — TETANUS-DIPHTH-ACELL PERTUSSIS 5-2.5-18.5 LF-MCG/0.5 IM SUSP: 0.5000 mL | Freq: Once | INTRAMUSCULAR | Status: DC

## 2019-02-08 MED ORDER — DIPHENHYDRAMINE HCL 25 MG PO CAPS: 25.0000 mg | ORAL_CAPSULE | Freq: Four times a day (QID) | ORAL | Status: DC | PRN

## 2019-02-08 MED ORDER — WITCH HAZEL-GLYCERIN EX PADS: 1.0000 "application " | MEDICATED_PAD | CUTANEOUS | Status: DC | PRN

## 2019-02-08 NOTE — Progress Notes (Addendum)
Called the the room urgently to evaluate patient for syncopal episode 2 hours post vaginal delivery.  According to the nurse, the patient voiced that she felt like she was going to pass out, and then did. A sternal rub was needed to rouse her after she was placed in Trendelenberg.  500 cc bolus of Lactated Ringers was started.  The patient remembers the event, but was slow to answer what her name is and where she is. Since delivery her headache and RUQ pain have resolved.  I called Dr. Rosana Hoes to the room, and she evacuated 156 mL of blood clot out of the vagina, bringing QBL >1000 mL. She is s/p 1000 mcg PR cytotec and 1 g TXA immediately after delivery.  Upon exam, uterine fundus was firm, and the patient had strong distal pulses. Neuro exam was symmetric. Breathing was unlabored, and the patient became more coherent as time passed.  Vitals:   02/07/19 2331 02/07/19 2359  BP: 107/72 120/85  Pulse: 84 (!) 104  Resp:    Temp:    SpO2:     Repeat BP was 120/85, Heart rate 104. Fingerstick blood sugar 188.  Post partum CBC showed Hgb 10.7, from 11.4. Platelets 259 from 256. Magnesium level 5.5 @ 1715 this afternoon.  Plan to repeat CBC, CMP, and Mag level. Continue 500 cc bolus LR. Observe for at least 1 hour, most likely this was a syncopal event. Transfusion if indicated. Close monitoring of the patient's bleeding.

## 2019-02-08 NOTE — Progress Notes (Signed)
MOB was referred for history of depression/anxiety. * Referral screened out by Clinical Social Worker because none of the following criteria appear to apply: ~ History of anxiety/depression during this pregnancy, or of post-partum depression following prior delivery. ~ Diagnosis of anxiety and/or depression within last 3 years OR * MOB's symptoms currently being treated with medication and/or therapy. MOB has an active Rx for Vistaril.   Please contact the Clinical Social Worker if needs arise, by MOB request, or if MOB scores greater than 9/yes to question 10 on Edinburgh Postpartum Depression Screen.  Summer Padilla, MSW, LCSW Clinical Social Work [phone]   

## 2019-02-08 NOTE — Progress Notes (Signed)
Recheck of patient shows much more alert and oriented, she is embarassed about her syncopal episode. She says that she feels much better after the 500 cc bolus.  Labs show Hgb 11.4>10.7>10.3 Mag level 5.4 CMP within normal limits.  BP improved to 139/65  OK to transfer to [facility] care Improvement in patient status relayed to Dr. Cain Saupe, DO OB Fellow Faculty Practice 02/08/19, [phone]

## 2019-02-08 NOTE — Anesthesia Postprocedure Evaluation (Signed)
Anesthesia Post Note  Patient: Summer Padilla  Procedure(s) Performed: AN AD [place]     Patient location during evaluation: Specials Recovery Anesthesia Type: Epidural Level of consciousness: awake and alert Pain management: pain level controlled Vital Signs Assessment: post-procedure vital signs reviewed and stable Respiratory status: spontaneous breathing Cardiovascular status: stable Anesthetic complications: no Comments: Unable to reach pt on phone. Spoke with Wynelle Bourgeois RN, who is taking care of pt. She reports pt is walking without problems with no complaints of headache or backache and is doing well.    Last Vitals:  Vitals:   02/08/19 0746 02/08/19 0835  BP: (!) 123/59   Pulse: 79   Resp: 18 18  Temp: 36.7 C   SpO2: 99%     Last Pain:  Vitals:   02/08/19 0835  TempSrc:   PainSc: 0-No pain   Pain Goal: Patients Stated Pain Goal: 3 (02/08/19 6761)                 Everette Rank

## 2019-02-08 NOTE — Progress Notes (Signed)
Post Partum Day # 1 IOL CHTN/SPEC, A2DM, PPH Subjective: Pt has no complaints this morning. Up to restroom without probelms. Pain controlled. Denies HA or visual changes. Tolerating diet  Objective: Blood pressure (!) 123/59, pulse 79, temperature 98.1 F (36.7 C), temperature source Oral, resp. rate 18, height 5\' 1"  (1.549 m), weight 102.9 kg, last menstrual period 05/15/2018, SpO2 99 %, unknown if currently breastfeeding.  Physical Exam:  General: alert Lochia: appropriate Uterine Fundus: firm Incision: healing well DVT Evaluation: No evidence of DVT seen on physical exam.  Recent Labs    02/08/19 0008 02/08/19 0526  HGB 10.3* 9.1*  HCT 31.6* 28.1*    Assessment/Plan: Stable. Magnesium x 24 hrs. Continue to monitor BP. No meds presently. Start iron. Continue with supportive care   LOS: 2 days   Chancy Milroy 02/08/2019, 9:14 AM

## 2019-02-08 NOTE — Lactation Note (Signed)
This note was copied from a baby's chart. Lactation Consultation Note  Patient Name: Girl Annaliesa Blann [id number] Date: 02/08/2019 Reason for consult: Initial assessment;Late-preterm 34-36.6wks;Infant weight loss P5.  Baby is 58 weeks old.  12 hours old.  Mom states she breastfed previous babies but had low milk supply and also used formula.  Discussed late preterm behavior and written information given.  Baby is bottle feeding 22 calorie formula and doing well per mother.  Mom has been taught how to hand express.  Symphony pump set up and initiated.  Nipples erect.  Instructed to pump and hand express every 3 hours.  Mom does not have a pump at home.  She is active with [facility] in [place].  Referral faxed.  Baby has attempted to latch.  Instructed to attempt latch prior to bottles.  Encouraged to call for assist prn.  Maternal Data Has patient been taught Hand Expression?: Yes Does the patient have breastfeeding experience prior to this delivery?: Yes  Feeding Feeding Type: Bottle Fed - Formula Nipple Type: Slow - flow  LATCH Score                   Interventions Interventions: DEBP  Lactation Tools Discussed/Used WIC Program: Yes Pump Review: Setup, frequency, and cleaning;Milk Storage Initiated by:: LMOULDEN Date initiated:: 02/08/19   Consult Status Consult Status: Follow-up Date: 02/09/19 Follow-up type: In-patient    Ave Filter 02/08/2019, 10:47 AM

## 2019-02-09 ENCOUNTER — Encounter: Payer: BC Managed Care – PPO | Admitting: Obstetrics and Gynecology

## 2019-02-09 ENCOUNTER — Ambulatory Visit: Payer: Self-pay

## 2019-02-09 MED ORDER — IBUPROFEN 600 MG PO TABS: 600.0000 mg | ORAL_TABLET | Freq: Three times a day (TID) | ORAL | 0 refills | Status: DC

## 2019-02-09 MED ORDER — FERROUS SULFATE 325 (65 FE) MG PO TABS: 325.0000 mg | ORAL_TABLET | Freq: Two times a day (BID) | ORAL | 1 refills | Status: DC

## 2019-02-09 MED ORDER — TRANEXAMIC ACID-NACL 1000-0.7 MG/100ML-% IV SOLN
1000.0000 mg | Freq: Once | INTRAVENOUS | Status: AC
  Administered 2019-02-09: 1000 mg via INTRAVENOUS
  Filled 2019-02-09 (×2): qty 100

## 2019-02-09 MED ORDER — OXYCODONE HCL 5 MG PO TABS: 5.0000 mg | ORAL_TABLET | ORAL | 0 refills | Status: DC | PRN

## 2019-02-09 NOTE — Progress Notes (Signed)
Patient given one spray of Afrin in each nostril and pressure continued for 15 minutes. Upon reassessment significantly less bleeding is noted. Patient states she can feel a clot in the Left nare, but states the right nare feels clear and is not bleeding anymore. Patient continuing to sit up and apply ice and pressure. Will reevaluate in 15 minutes.

## 2019-02-09 NOTE — Progress Notes (Signed)
Dr Nehemiah Settle notified of patient's nose bleed after passing clot in Left Nare. Dr Nehemiah Settle ordered 1,000 Mg of Txa to be given IV.

## 2019-02-09 NOTE — Progress Notes (Signed)
At 0208 very scant amount bleeding noted on guaze at the completion of TXA 1000mg  IV.

## 2019-02-09 NOTE — Progress Notes (Signed)
Patient experiencing a nose bleed that has been going on for 15 minutes. Applied ice, and pressure. Nose bleed started to slow down, then a clot came out when patient blew her nose and it started again. RN told patient to try not to blow her nose while it is bleeding. Continued to hold pressure and ice. Dr Nehemiah Settle notified, see new orders. RN in room continuing to monitor.

## 2019-02-09 NOTE — Lactation Note (Signed)
This note was copied from a baby's chart. Lactation Consultation Note:  Mother reports that she is now planning to pump and bottle feed.  She has not pumped today.  Discussed importance of pumping every 2-3 hours for milk to come to volume.   Mother reports that her breast are filling slightly.   She denies having any breastfeeding questions or concerns. Mother has a hand pump at the bedside.  Mother to follow with [facility] on discharge to get DEBP.  Patient Name: Summer Padilla [id number] Date: 02/09/2019 Reason for consult: Follow-up assessment   Maternal Data    Feeding    LATCH Score                   Interventions Interventions: Hand express;Hand pump;DEBP  Lactation Tools Discussed/Used     Consult Status Consult Status: Follow-up Date: 02/10/19 Follow-up type: In-patient    Jess Barters [facility] 02/09/2019, 3:40 PM

## 2019-02-09 NOTE — Progress Notes (Signed)
Discharge instructions reviewed with patient.  Discussed follow up appointment, medication changes and postpartum care. Patient verbalized understanding. IV removed. Patient to stay in the room as a baby patient.

## 2019-02-09 NOTE — Progress Notes (Signed)
RN in room to assess nose bleed. Noted no bleeding, patient states that she can just feel a clot in the left nare. Pt denied further needs at this time, RN will continue to monitor.

## 2019-02-10 LAB — SURGICAL PATHOLOGY

## 2019-02-14 ENCOUNTER — Ambulatory Visit (HOSPITAL_COMMUNITY): Payer: BC Managed Care – PPO

## 2019-02-15 ENCOUNTER — Telehealth: Payer: Self-pay | Admitting: *Deleted

## 2019-02-15 ENCOUNTER — Encounter: Payer: BC Managed Care – PPO | Admitting: Advanced Practice Midwife

## 2019-02-15 NOTE — Telephone Encounter (Signed)
Left patient a message to call and reschedule missed BP appointment on 02/15/19 at 9:35am.

## 2019-02-17 ENCOUNTER — Telehealth: Payer: Self-pay | Admitting: *Deleted

## 2019-02-17 ENCOUNTER — Inpatient Hospital Stay (HOSPITAL_COMMUNITY)
Admission: AD | Admit: 2019-02-17 | Discharge: 2019-02-17 | Disposition: A | Discharge status: Home / Self Care | Payer: BC Managed Care – PPO | Attending: Obstetrics & Gynecology | Admitting: Obstetrics & Gynecology

## 2019-02-17 ENCOUNTER — Encounter (HOSPITAL_COMMUNITY): Payer: Self-pay | Admitting: *Deleted

## 2019-02-17 ENCOUNTER — Other Ambulatory Visit: Payer: Self-pay

## 2019-02-17 DIAGNOSIS — G4489 Other headache syndrome: Secondary | ICD-10-CM | POA: Diagnosis not present

## 2019-02-17 DIAGNOSIS — Z8759 Personal history of other complications of pregnancy, childbirth and the puerperium: Secondary | ICD-10-CM

## 2019-02-17 DIAGNOSIS — R030 Elevated blood-pressure reading, without diagnosis of hypertension: Secondary | ICD-10-CM | POA: Insufficient documentation

## 2019-02-17 DIAGNOSIS — O9089 Other complications of the puerperium, not elsewhere classified: Secondary | ICD-10-CM | POA: Insufficient documentation

## 2019-02-17 LAB — COMPREHENSIVE METABOLIC PANEL
ALT: 20 U/L (ref 0–44)
AST: 21 U/L (ref 15–41)
Albumin: 3 g/dL — ABNORMAL LOW (ref 3.5–5.0)
Alkaline Phosphatase: 72 U/L (ref 38–126)
Anion gap: 13 (ref 5–15)
BUN: 10 mg/dL (ref 6–20)
CO2: 24 mmol/L (ref 22–32)
Calcium: 9.1 mg/dL (ref 8.9–10.3)
Chloride: 103 mmol/L (ref 98–111)
Creatinine, Ser: 0.69 mg/dL (ref 0.44–1.00)
GFR calc Af Amer: >60 mL/min (ref >60)
GFR calc non Af Amer: >60 mL/min (ref >60)
Glucose, Bld: 107 mg/dL — ABNORMAL HIGH (ref 70–99)
Potassium: 3.8 mmol/L (ref 3.5–5.1)
Sodium: 140 mmol/L (ref 135–145)
Total Bilirubin: 0.6 mg/dL (ref 0.3–1.2)
Total Protein: 5.9 g/dL — ABNORMAL LOW (ref 6.5–8.1)

## 2019-02-17 LAB — CBC
HCT: 29.9 % — ABNORMAL LOW (ref 36.0–46.0)
Hemoglobin: 9.1 g/dL — ABNORMAL LOW (ref 12.0–15.0)
MCH: 26.7 pg (ref 26.0–34.0)
MCHC: 30.4 g/dL (ref 30.0–36.0)
MCV: 87.7 fL (ref 80.0–100.0)
Platelets: 334 10*3/uL (ref 150–400)
RBC: 3.41 MIL/uL — ABNORMAL LOW (ref 3.87–5.11)
RDW: 14.3 % (ref 11.5–15.5)
WBC: 8 10*3/uL (ref 4.0–10.5)
nRBC: 0 % (ref 0.0–0.2)

## 2019-02-17 LAB — URINALYSIS, ROUTINE W REFLEX MICROSCOPIC
Bilirubin Urine: NEGATIVE
Glucose, UA: NEGATIVE mg/dL
Ketones, ur: NEGATIVE mg/dL
Nitrite: NEGATIVE
Protein, ur: NEGATIVE mg/dL
Specific Gravity, Urine: 1.004 — ABNORMAL LOW (ref 1.005–1.030)
WBC, UA: >50 WBC/hpf — ABNORMAL HIGH (ref 0–5)
pH: 7 (ref 5.0–8.0)

## 2019-02-17 LAB — PROTEIN / CREATININE RATIO, URINE
Creatinine, Urine: 26.03 mg/dL
Protein Creatinine Ratio: 0.61 mg/mg{Cre} — ABNORMAL HIGH (ref 0.00–0.15)
Total Protein, Urine: 16 mg/dL

## 2019-02-17 MED ORDER — DEXAMETHASONE SODIUM PHOSPHATE 10 MG/ML IJ SOLN
10.0000 mg | Freq: Once | INTRAMUSCULAR | Status: AC
  Administered 2019-02-17: 10 mg via INTRAVENOUS
  Filled 2019-02-17: qty 1

## 2019-02-17 MED ORDER — LACTATED RINGERS IV BOLUS
1000.0000 mL | Freq: Once | INTRAVENOUS | Status: AC
  Administered 2019-02-17: 12:00:00 1000 mL via INTRAVENOUS

## 2019-02-17 MED ORDER — METOCLOPRAMIDE HCL 5 MG/ML IJ SOLN
10.0000 mg | Freq: Once | INTRAMUSCULAR | Status: AC
  Administered 2019-02-17: 12:00:00 10 mg via INTRAVENOUS
  Filled 2019-02-17: qty 2

## 2019-02-17 MED ORDER — DIPHENHYDRAMINE HCL 50 MG/ML IJ SOLN
12.5000 mg | Freq: Once | INTRAMUSCULAR | Status: AC
  Administered 2019-02-17: 12:00:00 12.5 mg via INTRAVENOUS
  Filled 2019-02-17: qty 1

## 2019-02-17 MED ORDER — ENALAPRIL MALEATE 20 MG PO TABS: 20.0000 mg | ORAL_TABLET | Freq: Every day | ORAL | 1 refills | Status: DC

## 2019-02-17 NOTE — MAU Note (Signed)
Post partum 01/08/2019. Vag delivery.  Pt reports stared having a headache yesterday and feeling tired and a little dizzy. Took b/p and it was in 150's/102. Still had headache today and pressure still elevated . On Baby ASA.

## 2019-02-17 NOTE — Telephone Encounter (Signed)
Could not leave patient a message due to mailbox being full. Will send a [facility] message.

## 2019-02-17 NOTE — Telephone Encounter (Signed)
-----   Message from Clarisa Fling, NP sent at 02/17/2019  1:32 PM EDT ----- Hello - this patient was seen in MAU today and started on BP medication. She needs a BP check on Monday AM. Please call to schedule. Thank you, Elmyra Ricks

## 2019-02-17 NOTE — Discharge Instructions (Signed)
Preeclampsia and Eclampsia °Preeclampsia is a serious condition that may develop during pregnancy. This condition causes high blood pressure and increased protein in your urine along with other symptoms, such as headaches and vision changes. These symptoms may develop as the condition gets worse. Preeclampsia may occur at 20 weeks of pregnancy or later. °Diagnosing and treating preeclampsia early is very important. If not treated early, it can cause serious problems for you and your baby. One problem it can lead to is eclampsia. Eclampsia is a condition that causes muscle jerking or shaking (convulsions or seizures) and other serious problems for the mother. During pregnancy, delivering your baby may be the best treatment for preeclampsia or eclampsia. For most women, preeclampsia and eclampsia symptoms go away after giving birth. °In rare cases, a woman may develop preeclampsia after giving birth (postpartum preeclampsia). This usually occurs within 48 hours after childbirth but may occur up to 6 weeks after giving birth. °What are the causes? °The cause of preeclampsia is not known. °What increases the risk? °The following risk factors make you more likely to develop preeclampsia: °· Being pregnant for the first time. °· Having had preeclampsia during a past pregnancy. °· Having a family history of preeclampsia. °· Having high blood pressure. °· Being pregnant with more than one baby. °· Being 35 or older. °· Being African-American. °· Having kidney disease or diabetes. °· Having medical conditions such as lupus or blood diseases. °· Being very overweight (obese). °What are the signs or symptoms? °The most common symptoms are: °· Severe headaches. °· Vision problems, such as blurred or double vision. °· Abdominal pain, especially upper abdominal pain. °Other symptoms that may develop as the condition gets worse include: °· Sudden weight gain. °· Sudden swelling of the hands, face, legs, and feet. °· Severe nausea  and vomiting. °· Numbness in the face, arms, legs, and feet. °· Dizziness. °· Urinating less than usual. °· Slurred speech. °· Convulsions or seizures. °How is this diagnosed? °There are no screening tests for preeclampsia. Your health care provider will ask you about symptoms and check for signs of preeclampsia during your prenatal visits. You may also have tests that include: °· Checking your blood pressure. °· Urine tests to check for protein. Your health care provider will check for this at every prenatal visit. °· Blood tests. °· Monitoring your baby's heart rate. °· Ultrasound. °How is this treated? °You and your health care provider will determine the treatment approach that is best for you. Treatment may include: °· Having more frequent prenatal exams to check for signs of preeclampsia, if you have an increased risk for preeclampsia. °· Medicine to lower your blood pressure. °· Staying in the hospital, if your condition is severe. There, treatment will focus on controlling your blood pressure and the amount of fluids in your body (fluid retention). °· Taking medicine (magnesium sulfate) to prevent seizures. This may be given as an injection or through an IV. °· Taking a low-dose aspirin during your pregnancy. °· Delivering your baby early. You may have your labor started with medicine (induced), or you may have a cesarean delivery. °Follow these instructions at home: °Eating and drinking ° °· Drink enough fluid to keep your urine pale yellow. °· Avoid caffeine. °Lifestyle °· Do not use any products that contain nicotine or tobacco, such as cigarettes and e-cigarettes. If you need help quitting, ask your health care provider. °· Do not use alcohol or drugs. °· Avoid stress as much as possible. Rest and get   plenty of sleep. °General instructions °· Take over-the-counter and prescription medicines only as told by your health care provider. °· When lying down, lie on your left side. This keeps pressure off your  major blood vessels. °· When sitting or lying down, raise (elevate) your feet. Try putting some pillows underneath your lower legs. °· Exercise regularly. Ask your health care provider what kinds of exercise are best for you. °· Keep all follow-up and prenatal visits as told by your health care provider. This is important. °How is this prevented? °There is no known way of preventing preeclampsia or eclampsia from developing. However, to lower your risk of complications and detect problems early: °· Get regular prenatal care. Your health care provider may be able to diagnose and treat the condition early. °· Maintain a healthy weight. Ask your health care provider for help managing weight gain during pregnancy. °· Work with your health care provider to manage any long-term (chronic) health conditions you have, such as diabetes or kidney problems. °· You may have tests of your blood pressure and kidney function after giving birth. °· Your health care provider may have you take low-dose aspirin during your next pregnancy. °Contact a health care provider if: °· You have symptoms that your health care provider told you may require more treatment or monitoring, such as: °? Headaches. °? Nausea or vomiting. °? Abdominal pain. °? Dizziness. °? Light-headedness. °Get help right away if: °· You have severe: °? Abdominal pain. °? Headaches that do not get better. °? Dizziness. °? Vision problems. °? Confusion. °? Nausea or vomiting. °· You have any of the following: °? A seizure. °? Sudden, rapid weight gain. °? Sudden swelling in your hands, ankles, or face. °? Trouble moving any part of your body. °? Numbness in any part of your body. °? Trouble speaking. °? Abnormal bleeding. °· You faint. °Summary °· Preeclampsia is a serious condition that may develop during pregnancy. °· This condition causes high blood pressure and increased protein in your urine along with other symptoms, such as headaches and vision  changes. °· Diagnosing and treating preeclampsia early is very important. If not treated early, it can cause serious problems for you and your baby. °· Get help right away if you have symptoms that your health care provider told you to watch for. °This information is not intended to replace advice given to you by your health care provider. Make sure you discuss any questions you have with your health care provider. °Document Released: 04/11/2000 Document Revised: 12/15/2017 Document Reviewed: 11/19/2015 °Elsevier Patient Education © 2020 [employer] ° °

## 2019-02-17 NOTE — Telephone Encounter (Signed)
Patient was a NO SHOW to her 1 week blood pressure check on 02/15/19 at 9:35am. Patient was left a voicemail and a MYCHART message that this needed to be rescheduled on 02/15/2019. Patient stated that she didn't remember that she had an appointment.

## 2019-02-17 NOTE — MAU Provider Note (Signed)
History     CSN: [id number]  Arrival date and time: 02/17/19 1014   First Provider Initiated Contact with Patient 02/17/19 1123      Chief Complaint  Patient presents with  . Hypertension  . Headache   Ms. Summer Padilla is a 38 y.o. [id number] at postpartum s/p NSVD at 02/07/2019 who presents to MAU for preeclampsia evaluation after she experienced elevated BP at home of 150s/100s. Pt has had her BP cuff checked for fit by her clinic. Of note, pt did receive magnesium x24hrs on the hospital.  Pt reports HA that started yesterday around 12pm. Pt reports history of migraine HA that she is not on any medication for. Pt reports she last took ibuprofen last night that did not help the HA at all. Pt denies taking anything for HA at this time. Pt rates HA as 6/10.  Pt denies blurry vision/seeing spots, N/V, epigastric pain, swelling in face and hands, sudden weight gain. Pt denies chest pain and SOB.  Pt denies constipation, diarrhea, or urinary problems. Pt denies fever, chills, fatigue, sweating or changes in appetite. Pt denies dizziness, light-headedness, weakness.  Current pregnancy problems? Severe preeclampsia, hx of eclampsia, GDM, cHTN Blood Type? O Positive Allergies? NKDA Current medications? Ibuprofen PRN, PNVs, baby ASA, colace [facility] appt 03/07/2019   OB History    Gravida  6   Para  5   Term  3   Preterm  2   AB  1   Living  5     SAB  1   TAB  0   Ectopic  0   Multiple  0   Live Births  5           Past Medical History:  Diagnosis Date  . Anxiety   . Benign hematuria   . Diabetes mellitus without complication (HCC)   . Gestational diabetes    with second pregnancy  . Gestational HTN    Sept 2012 post partum  . Murmur, cardiac   . 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 ABDOMINOPLASTY    .  CESAREAN SECTION    . HAND SURGERY      Family History  Problem Relation Age of Onset  . Cancer Mother        cervical  . Diabetes Maternal Aunt   . Hearing loss Maternal Grandmother   . Breast cancer Paternal Aunt   . Breast cancer Paternal Aunt   . Breast cancer Cousin     Social History   Tobacco Use  . Smoking status: Never Smoker  . Smokeless tobacco: Never Used  Substance Use Topics  . Alcohol use: No  . Drug use: No    Allergies: No Known Allergies  Medications Prior to Admission  Medication Sig Dispense Refill Last Dose  . aspirin EC 81 MG tablet Take 81 mg by mouth daily.   02/16/2019 at 2200  . docusate sodium (COLACE) 100 MG capsule Take 100 mg by mouth 2 (two) times daily.   02/16/2019 at 2000  . ferrous sulfate 325 (65 FE) MG tablet Take 1 tablet (325 mg total) by mouth 2 (two) times daily with a meal. 60 tablet 1   . ibuprofen (ADVIL) 600 MG tablet Take 1 tablet (600 mg total) by mouth 3 (three) times daily. 30 tablet 0 02/16/2019 at 1800  . oxyCODONE (OXY  IR/ROXICODONE) 5 MG immediate release tablet Take 1 tablet (5 mg total) by mouth every 4 (four) hours as needed (pain scale 4-7). 24 tablet 0 02/16/2019 at 1300  . Prenatal Vit-Fe Fumarate-FA (PRENATAL MULTIVITAMIN) TABS tablet Take 1 tablet by mouth every other day. 30 tablet 12 02/16/2019 at 1400  . famotidine (PEPCID) 20 MG tablet Take 1 tablet (20 mg total) by mouth 2 (two) times daily as needed for heartburn or indigestion. 60 tablet 2     Review of Systems  Constitutional: Negative for chills, diaphoresis, fatigue and fever.  Eyes: Negative for visual disturbance.  Respiratory: Negative for shortness of breath.   Cardiovascular: Negative for chest pain.  Gastrointestinal: Negative for abdominal pain, constipation, diarrhea, nausea and vomiting.  Genitourinary: Negative for dysuria, flank pain, frequency, pelvic pain, urgency, vaginal bleeding and vaginal discharge.  Neurological: Positive for  headaches. Negative for dizziness, weakness and light-headedness.   Physical Exam   Blood pressure 130/69, pulse 90, temperature 98.4 F (36.9 C), temperature source Oral, resp. rate 20, height 5\' 1"  (1.549 m), weight 96.6 kg, SpO2 99 %, unknown if currently breastfeeding.  Patient Vitals for the past 24 hrs:  BP Temp Temp src Pulse Resp SpO2 Height Weight  02/17/19 1316 130/69 98.4 F (36.9 C) Oral 90 20 99 % - -  02/17/19 1301 134/77 - - 81 - - - -  02/17/19 1246 136/83 - - 85 - - - -  02/17/19 1216 130/85 - - 79 - - - -  02/17/19 1200 129/66 - - 72 - - - -  02/17/19 1146 138/81 - - 78 - - - -  02/17/19 1131 (!) 141/80 - - 83 - - - -  02/17/19 1116 133/83 - - 76 - - - -  02/17/19 1100 (!) 144/87 - - 75 - - - -  02/17/19 1033 (!) 145/93 98.3 F (36.8 C) - 88 18 - 5\' 1"  (1.549 m) 96.6 kg   Physical Exam  Constitutional: She is oriented to person, place, and time. She appears well-developed and well-nourished. No distress.  HENT:  Head: Normocephalic and atraumatic.  Respiratory: Effort normal.  GI: Soft. She exhibits no distension and no mass. There is no abdominal tenderness. There is no rebound and no guarding.  Neurological: She is alert and oriented to person, place, and time.  Skin: Skin is warm and dry. She is not diaphoretic.  Psychiatric: She has a normal mood and affect. Her behavior is normal. Judgment and thought content normal.   Results for orders placed or performed during the hospital encounter of 02/17/19 (from the past 24 hour(s))  CBC     Status: Abnormal   Collection Time: 02/17/19 11:32 AM  Result Value Ref Range   WBC 8.0 4.0 - 10.5 K/uL   RBC 3.41 (L) 3.87 - 5.11 MIL/uL   Hemoglobin 9.1 (L) 12.0 - 15.0 g/dL   HCT 02/19/19 (L) 02/19/19 - 40.3 %   MCV 87.7 80.0 - 100.0 fL   MCH 26.7 26.0 - 34.0 pg   MCHC 30.4 30.0 - 36.0 g/dL   RDW 47.4 25.9 - 56.3 %   Platelets 334 150 - 400 K/uL   nRBC 0.0 0.0 - 0.2 %  Comprehensive metabolic panel     Status: Abnormal    Collection Time: 02/17/19 11:32 AM  Result Value Ref Range   Sodium 140 135 - 145 mmol/L   Potassium 3.8 3.5 - 5.1 mmol/L   Chloride 103 98 - 111 mmol/L  CO2 24 22 - 32 mmol/L   Glucose, Bld 107 (H) 70 - 99 mg/dL   BUN 10 6 - 20 mg/dL   Creatinine, Ser 0.69 0.44 - 1.00 mg/dL   Calcium 9.1 8.9 - 10.3 mg/dL   Total Protein 5.9 (L) 6.5 - 8.1 g/dL   Albumin 3.0 (L) 3.5 - 5.0 g/dL   AST 21 15 - 41 U/L   ALT 20 0 - 44 U/L   Alkaline Phosphatase 72 38 - 126 U/L   Total Bilirubin 0.6 0.3 - 1.2 mg/dL   GFR calc non Af Amer >60 >60 mL/min   GFR calc Af Amer >60 >60 mL/min   Anion gap 13 5 - 15  Protein / creatinine ratio, urine     Status: Abnormal   Collection Time: 02/17/19 11:55 AM  Result Value Ref Range   Creatinine, Urine 26.03 mg/dL   Total Protein, Urine 16 mg/dL   Protein Creatinine Ratio 0.61 (H) 0.00 - 0.15 mg/mg[Cre]  Urinalysis, Routine w reflex microscopic     Status: Abnormal   Collection Time: 02/17/19 11:55 AM  Result Value Ref Range   Color, Urine YELLOW YELLOW   APPearance CLEAR CLEAR   Specific Gravity, Urine 1.004 (L) 1.005 - 1.030   pH 7.0 5.0 - 8.0   Glucose, UA NEGATIVE NEGATIVE mg/dL   Hgb urine dipstick LARGE (A) NEGATIVE   Bilirubin Urine NEGATIVE NEGATIVE   Ketones, ur NEGATIVE NEGATIVE mg/dL   Protein, ur NEGATIVE NEGATIVE mg/dL   Nitrite NEGATIVE NEGATIVE   Leukocytes,Ua LARGE (A) NEGATIVE   RBC / HPF 11-20 0 - 5 RBC/hpf   WBC, UA >50 (H) 0 - 5 WBC/hpf   Bacteria, UA FEW (A) NONE SEEN   Squamous Epithelial / LPF 0-5 0 - 5   Mucus PRESENT    MAU Course  Procedures  MDM -postpartum preeclampsia evaluation with severe preeclampsia as reason for induction @35wks , Mg x24hrs in hospital at delivery, hx of eclampsia, no BP meds at discharge from hospital, elevated BP in MAU 140s/90s, hx cHTN -HA cocktail given, pt reports HA now 0/10 -UA: SG 1.004/lg hgb/lg leuks/few bacteria, sending urine for culture -CBC: H/H 9.1/29.9 (was same at discharge from  hospital), platelets 334 -CMP: AST/ALT 21/20, serum creatinine 0.69 (AST/ALT were 44/35, serum creatinine 0.76 on 02/08/2019) -PCr discontinued -called and spoke with Dr. Elly Modena @1256PM . Per Dr. Elly Modena, start on enalapril 20mg  daily with a BP check on Monday 02/21/2019. -pt discharged to home in stable condition  Orders Placed This Encounter  Procedures  . Culture, OB Urine    Standing Status:   Standing    Number of Occurrences:   1  . Urinalysis, Routine w reflex microscopic    Standing Status:   Standing    Number of Occurrences:   1  . CBC    Standing Status:   Standing    Number of Occurrences:   1  . Comprehensive metabolic panel    Standing Status:   Standing    Number of Occurrences:   1  . Insert peripheral IV    Standing Status:   Standing    Number of Occurrences:   1  . Discharge patient    Order Specific Question:   Discharge disposition    Answer:   01-Home or Self Care [1]    Order Specific Question:   Discharge patient date    Answer:   02/17/2019   Meds ordered this encounter  Medications  . lactated ringers bolus  1,000 mL  . metoCLOPramide (REGLAN) injection 10 mg  . diphenhydrAMINE (BENADRYL) injection 12.5 mg  . dexamethasone (DECADRON) injection 10 mg  . enalapril (VASOTEC) 20 MG tablet    Sig: Take 1 tablet (20 mg total) by mouth daily.    Dispense:  30 tablet    Refill:  1    Order Specific Question:   Supervising Provider    Answer:   Jaynie Collins A [[id number]]   Assessment and Plan   1. Elevated blood pressure reading   2. Postpartum state   3. Other headache syndrome   4. History of eclampsia    Allergies as of 02/17/2019   No Known Allergies     Medication List    TAKE these medications   aspirin EC 81 MG tablet Take 81 mg by mouth daily.   docusate sodium 100 MG capsule Commonly known as: COLACE Take 100 mg by mouth 2 (two) times daily.   enalapril 20 MG tablet Commonly known as: VASOTEC Take 1 tablet (20 mg total) by  mouth daily.   famotidine 20 MG tablet Commonly known as: Pepcid Take 1 tablet (20 mg total) by mouth 2 (two) times daily as needed for heartburn or indigestion.   ferrous sulfate 325 (65 FE) MG tablet Take 1 tablet (325 mg total) by mouth 2 (two) times daily with a meal.   ibuprofen 600 MG tablet Commonly known as: ADVIL Take 1 tablet (600 mg total) by mouth 3 (three) times daily.   oxyCODONE 5 MG immediate release tablet Commonly known as: Oxy IR/ROXICODONE Take 1 tablet (5 mg total) by mouth every 4 (four) hours as needed (pain scale 4-7).   prenatal multivitamin Tabs tablet Take 1 tablet by mouth every other day.      -will call with culture results, if positive -pt to continue iron as previously prescribed, discussed continued low hgb on labs today -RX enalapril  -message sent to [facility] to schedule patient for BP check on Monday 02/20/2019, pt aware to call tomorrow morning if no appointment scheduled by end of day today -strict preeclampsia/return MAU precautions given -pt discharged to home in stable condition  Joni Reining E Katiya Fike 02/17/2019, 1:36 PM

## 2019-02-17 NOTE — Telephone Encounter (Signed)
Patient called the office and stated that blood pressure is 150/105 and having a headache since yesterday. Patient stated that the headache is still present. Patient advised that she needs to go to MAU for evaluation. No provider is at the [place] office until 1:00pm. Patient agreed.

## 2019-02-18 LAB — CULTURE, OB URINE

## 2019-02-21 ENCOUNTER — Encounter: Payer: Self-pay | Admitting: Obstetrics & Gynecology

## 2019-02-21 ENCOUNTER — Other Ambulatory Visit: Payer: Self-pay

## 2019-02-21 ENCOUNTER — Ambulatory Visit (INDEPENDENT_AMBULATORY_CARE_PROVIDER_SITE_OTHER): Payer: BC Managed Care – PPO

## 2019-02-21 NOTE — Progress Notes (Signed)
Pt is here for BP check. BP 124/82. Pt has no complaints. Pt is currently taking Vastotec 20mg  and will continue to take meds per Dr.Leggett.

## 2019-03-07 ENCOUNTER — Telehealth: Payer: Self-pay | Admitting: *Deleted

## 2019-03-07 ENCOUNTER — Ambulatory Visit: Payer: BC Managed Care – PPO | Admitting: Obstetrics & Gynecology

## 2019-03-07 NOTE — Telephone Encounter (Signed)
Left patient an urgent message to call to reschedule her missed Postpartum appointment on 03/07/2019 at 8:15 AM.

## 2019-03-17 ENCOUNTER — Ambulatory Visit: Payer: BC Managed Care – PPO | Admitting: Obstetrics & Gynecology

## 2019-03-21 ENCOUNTER — Other Ambulatory Visit: Payer: Self-pay

## 2019-03-21 ENCOUNTER — Telehealth (INDEPENDENT_AMBULATORY_CARE_PROVIDER_SITE_OTHER): Payer: BC Managed Care – PPO | Admitting: Family Medicine

## 2019-03-21 ENCOUNTER — Encounter: Payer: Self-pay | Admitting: Family Medicine

## 2019-03-21 DIAGNOSIS — I10 Essential (primary) hypertension: Secondary | ICD-10-CM

## 2019-03-21 DIAGNOSIS — O99345 Other mental disorders complicating the puerperium: Secondary | ICD-10-CM | POA: Insufficient documentation

## 2019-03-21 DIAGNOSIS — F53 Postpartum depression: Secondary | ICD-10-CM | POA: Insufficient documentation

## 2019-03-21 DIAGNOSIS — Z3041 Encounter for surveillance of contraceptive pills: Secondary | ICD-10-CM

## 2019-03-21 DIAGNOSIS — Z8632 Personal history of gestational diabetes: Secondary | ICD-10-CM

## 2019-03-21 DIAGNOSIS — N898 Other specified noninflammatory disorders of vagina: Secondary | ICD-10-CM

## 2019-03-21 MED ORDER — SERTRALINE HCL 50 MG PO TABS: 50.0000 mg | ORAL_TABLET | Freq: Every day | ORAL | 1 refills | Status: DC

## 2019-03-21 MED ORDER — NORETHINDRONE 0.35 MG PO TABS: 1.0000 | ORAL_TABLET | Freq: Every day | ORAL | 3 refills | Status: DC

## 2019-03-21 MED ORDER — FLUCONAZOLE 150 MG PO TABS: 150.0000 mg | ORAL_TABLET | Freq: Every day | ORAL | 2 refills | Status: AC

## 2019-03-21 NOTE — Progress Notes (Signed)
TELEHEALTH POSTPARTUM VIRTUAL VIDEO VISIT ENCOUNTER NOTE   Provider location: [facility] for [facility] at [place]   I connected with [name] on 03/21/19 at  4:00 PM EST by Doxy Video Encounter at home and verified that I am speaking with the correct person using two identifiers.    I discussed the limitations, risks, security and privacy concerns of performing an evaluation and management service virtually and the availability of in person appointments. I also discussed with the patient that there may be a patient responsible charge related to this service. The patient expressed understanding and agreed to proceed.  Chief Complaint: Postpartum Visit  History of Present Illness: Summer Padilla is a 38 y.o. Hispanic [id number] being evaluated for postpartum followup.    She is s/p normal spontaneous vaginal delivery on 02/07/2019 at 35 weeks; she was discharged to home on PPD#2. Pregnancy complicated by GDM, Anxiety and Pre-eclampsia superimposed on CHTN. She is still on Enalapril with good results. Baby is doing well Breast and bottle feeding with Neosure.  Complains of being sad, frequent crying spells. Notes no bleeding. Vaginal itching. Had diabetes screen with new insurance 2 wks ago and negative.  Vaginal bleeding or discharge: Yes  Intercourse: No  Contraception: vasectomy Mode of feeding infant: Bottle and Breast PP depression s/s: Yes .  Any bowel or bladder issues: No  Pap smear: no abnormalities (date: 08/09/2018)  Review of Systems: Positive for depression and itching. Her 12 point review of systems is negative or as noted in the History of Present Illness.  Patient Active Problem List   Diagnosis Date Noted  . Postpartum depression 03/21/2019  . History of gestational diabetes 01/18/2019  . Benign essential hypertension 10/25/2018  . Anxiety 10/25/2018    Medications Kesa R. Allshouse had no medications administered during this visit.  Current Outpatient Medications  Medication Sig Dispense Refill  . Prenatal Vit-Fe Fumarate-FA (PRENATAL MULTIVITAMIN) TABS tablet Take 1 tablet by mouth every other day. 30 tablet 12  . aspirin EC 81 MG tablet Take 81 mg by mouth daily.    Marland Kitchen docusate sodium (COLACE) 100 MG capsule Take 100 mg by mouth 2 (two) times daily.    . enalapril (VASOTEC) 20 MG tablet Take 1 tablet (20 mg total) by mouth daily. (Patient not taking: Reported on 03/21/2019) 30 tablet 1  . fluconazole (DIFLUCAN) 150 MG tablet Take 1 tablet (150 mg total) by mouth daily for 1 day. Repeat in 24 hours if needed 2 tablet 2  . norethindrone (MICRONOR) 0.35 MG tablet Take 1 tablet (0.35 mg total) by mouth daily. 3 Package 3  . sertraline (ZOLOFT) 50 MG tablet Take 1 tablet (50 mg total) by mouth daily. 30 tablet 1   No current facility-administered medications for this visit.     Allergies Patient has no known allergies.  Physical Exam:  Breastfeeding Yes  BP 120/80 yesterday General:  Alert, oriented and cooperative. Patient is in no acute distress.  Mental Status: Normal mood and affect. Normal behavior. Normal judgment and thought content.   Respiratory: Normal respiratory effort noted, no problems with respiration noted  Rest of physical exam deferred due to type of encounter  PP Depression Screening:   Edinburgh Postnatal Depression Scale Screening Tool 03/21/2019  I have been able to laugh and see the funny side of things. 2  I have looked forward with enjoyment to things. 1  I have blamed myself unnecessarily when things went wrong. 1  I have been anxious  or worried for no good reason. 1  I have felt scared or panicky for no good reason. 1  Things have been getting on top of me. 2  I have been so unhappy that I have had difficulty sleeping. 0  I have felt sad or miserable. 2  I have been so unhappy that I have been crying. 1  The thought of harming myself has occurred to me. 0  Edinburgh Postnatal Depression  Scale Total 11     Assessment:Patient is a 38 y.o. [id number] who is 6 weeks postpartum from a normal spontaneous vaginal delivery.  She is doing well.   Plan:  1. History of gestational diabetes Neg screen 2 wks ago--> annual screen discussed  2. Postpartum depression Begin Zoloft, 1/2 tab x 3-5 days until tolerating thien increase to 1 daily--f/u in 4 wks. - sertraline (ZOLOFT) 50 MG tablet; Take 1 tablet (50 mg total) by mouth daily.  Dispense: 30 tablet; Refill: 1  3. Encounter for surveillance of contraceptive pills POP + condoms until vasectomy done and cleared by urology - norethindrone (MICRONOR) 0.35 MG tablet; Take 1 tablet (0.35 mg total) by mouth daily.  Dispense: 3 Package; Refill: 3  4. Vagina itching Trial of Diflucan if persists, may come in for self swab. - fluconazole (DIFLUCAN) 150 MG tablet; Take 1 tablet (150 mg total) by mouth daily for 1 day. Repeat in 24 hours if needed  Dispense: 2 tablet; Refill: 2  5. Benign essential hypertension Continue Enalapril   RTC 4 wks  I discussed the assessment and treatment plan with the patient. The patient was provided an opportunity to ask questions and all were answered. The patient agreed with the plan and demonstrated an understanding of the instructions.   The patient was advised to call back or seek an in-person evaluation/go to the ED for any concerning postpartum symptoms.  I provided 15 minutes of face-to-face time during this encounter.   [name], MD [facility] for [facility], [facility]

## 2019-03-21 NOTE — Progress Notes (Signed)
.  cwh 

## 2019-03-21 NOTE — Patient Instructions (Signed)

## 2019-04-18 ENCOUNTER — Ambulatory Visit: Payer: BC Managed Care – PPO | Admitting: Obstetrics & Gynecology

## 2019-04-18 DIAGNOSIS — Z09 Encounter for follow-up examination after completed treatment for conditions other than malignant neoplasm: Secondary | ICD-10-CM

## 2020-03-29 ENCOUNTER — Encounter: Payer: Self-pay | Admitting: Obstetrics and Gynecology

## 2020-03-29 ENCOUNTER — Other Ambulatory Visit: Payer: Self-pay

## 2020-03-29 ENCOUNTER — Ambulatory Visit (INDEPENDENT_AMBULATORY_CARE_PROVIDER_SITE_OTHER): Payer: BC Managed Care – PPO | Admitting: Obstetrics and Gynecology

## 2020-03-29 ENCOUNTER — Other Ambulatory Visit (HOSPITAL_COMMUNITY)
Admission: RE | Admit: 2020-03-29 | Discharge: 2020-03-29 | Disposition: A | Discharge status: Home / Self Care | Payer: BC Managed Care – PPO | Source: Ambulatory Visit | Attending: Obstetrics and Gynecology | Admitting: Obstetrics and Gynecology

## 2020-03-29 VITALS — BP 136/87 | HR 88 | Resp 16 | Ht 64.0 in | Wt 223.0 lb

## 2020-03-29 DIAGNOSIS — Z113 Encounter for screening for infections with a predominantly sexual mode of transmission: Secondary | ICD-10-CM | POA: Insufficient documentation

## 2020-03-29 DIAGNOSIS — Z1231 Encounter for screening mammogram for malignant neoplasm of breast: Secondary | ICD-10-CM

## 2020-03-29 DIAGNOSIS — Z124 Encounter for screening for malignant neoplasm of cervix: Secondary | ICD-10-CM | POA: Diagnosis not present

## 2020-03-29 DIAGNOSIS — N898 Other specified noninflammatory disorders of vagina: Secondary | ICD-10-CM

## 2020-03-29 DIAGNOSIS — Z01419 Encounter for gynecological examination (general) (routine) without abnormal findings: Secondary | ICD-10-CM | POA: Diagnosis not present

## 2020-03-29 NOTE — Progress Notes (Signed)
GYNECOLOGY ANNUAL PREVENTATIVE CARE ENCOUNTER NOTE  Subjective:   Summer Padilla is a 39 y.o. [phone] female here for a annual gynecologic exam. Current complaints: vaginal itching, no odor. Due for pap and mammogram. Monthly periods, no issues.   Denies abnormal vaginal bleeding, discharge, pelvic pain, problems with intercourse or other gynecologic concerns. Accepts STI screen.   Gynecologic History Patient's last menstrual period was 03/14/2020. Contraception: vasectomy Last Pap: 07/2018 Results: negative Last mammogram: 05/2019. Results: rec repeat 6 months DEXA: has never had  Obstetric History OB History  Gravida Para Term Preterm AB Living  6 5 3 2 1 5   SAB TAB Ectopic Multiple Live Births  1 0 0 0 5    # Outcome Date GA Lbr Len/2nd Weight Sex Delivery Anes PTL Lv  6 Preterm 02/07/19 [redacted]w[redacted]d / 00:11 5 lb 15.9 oz (2.719 kg) F Vag-Spont EPI  LIV  5 Preterm 08/13/12 [redacted]w[redacted]d 04:17 / 00:08 6 lb 3.8 oz (2.83 kg) M Vag-Spont EPI  LIV     Birth Comments: Bruised face  4 Term      Vag-Spont     3 Term      CS-LTranv     2 Term      Vag-Spont     1 SAB             Past Medical History:  Diagnosis Date  . Anxiety   . Benign hematuria   . Diabetes mellitus without complication (HCC)   . Gestational diabetes    with second pregnancy  . Gestational HTN    Sept 2012 post partum  . Murmur, cardiac   . 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 ABDOMINOPLASTY    . CESAREAN SECTION    . HAND SURGERY      Current Outpatient Medications on File Prior to Visit  Medication Sig Dispense Refill  . ALPRAZolam (XANAX) 0.5 MG tablet Take by mouth.    . propranolol (INNOPRAN XL) 120 MG 24 hr capsule Take by mouth.     No current facility-administered medications on file prior to visit.    No Known Allergies  Social History   Socioeconomic History  . Marital status: Legally  Separated    Spouse name: Not on file  . Number of children: Not on file  . Years of education: Not on file  . Highest education level: Not on file  Occupational History  . Not on file  Tobacco Use  . Smoking status: Never Smoker  . Smokeless tobacco: Never Used  Vaping Use  . Vaping Use: Never used  Substance and Sexual Activity  . Alcohol use: No  . Drug use: No  . Sexual activity: Yes    Partners: Female    Birth control/protection: Other-see comments    Comment: husband had vasectomy  Other Topics Concern  . Not on file  Social History Narrative  . Not on file   Social Determinants of Health   Financial Resource Strain:   . Difficulty of Paying Living Expenses: Not on file  Food Insecurity:   . Worried About 07/22/2012 in the Last Year: Not on file  . Ran Out of Food in the Last Year: Not on file  Transportation Needs:   . Lack of Transportation (Medical): Not on file  . Lack of Transportation (Non-Medical): Not on file  Physical Activity:   .  Days of Exercise per Week: Not on file  . Minutes of Exercise per Session: Not on file  Stress:   . Feeling of Stress : Not on file  Social Connections:   . Frequency of Communication with Friends and Family: Not on file  . Frequency of Social Gatherings with Friends and Family: Not on file  . Attends Religious Services: Not on file  . Active Member of Clubs or Organizations: Not on file  . Attends Banker Meetings: Not on file  . Marital Status: Not on file  Intimate Partner Violence:   . Fear of Current or Ex-Partner: Not on file  . Emotionally Abused: Not on file  . Physically Abused: Not on file  . Sexually Abused: Not on file    Family History  Problem Relation Age of Onset  . Cancer Mother        cervical  . Diabetes Maternal Aunt   . Hearing loss Maternal Grandmother   . Breast cancer Paternal Aunt   . Breast cancer Paternal Aunt   . Breast cancer Cousin     The following portions  of the patient's history were reviewed and updated as appropriate: allergies, current medications, past family history, past medical history, past social history, past surgical history and problem list.  Review of Systems Pertinent items are noted in HPI.   Objective:  BP 136/87   Pulse 88   Resp 16   Ht 5\' 4"  (1.626 m)   Wt 223 lb (101.2 kg)   LMP 03/14/2020   Breastfeeding No   BMI 38.28 kg/m  CONSTITUTIONAL: Well-developed, well-nourished female in no acute distress.  HENT:  Normocephalic, atraumatic, External right and left ear normal. Oropharynx is clear and moist EYES: Conjunctivae and EOM are normal. Pupils are equal, round, and reactive to light. No scleral icterus.  NECK: Normal range of motion, supple, no masses.  Normal thyroid.  SKIN: Skin is warm and dry. No rash noted. Not diaphoretic. No erythema. No pallor. NEUROLOGIC: Alert and oriented to person, place, and time. Normal reflexes, muscle tone coordination. No cranial nerve deficit noted. PSYCHIATRIC: Normal mood and affect. Normal behavior. Normal judgment and thought content. CARDIOVASCULAR: Normal heart rate noted RESPIRATORY: ffort normal, no problems with respiration noted. BREASTS: Symmetric in size. No masses, skin changes, nipple drainage, or lymphadenopathy. ABDOMEN: Soft, no distention noted.  No tenderness, rebound or guarding.  PELVIC: Normal appearing external genitalia; normal appearing vaginal mucosa and cervix.  No abnormal discharge noted.  Pap smear obtained. Pelvic cultures obtained. Normal uterine size, no other palpable masses, no uterine or adnexal tenderness. MUSCULOSKELETAL: Normal range of motion. No tenderness.  No cyanosis, clubbing, or edema.  2+ distal pulses.  Exam done with chaperone present.  Assessment and Plan:   1. Well woman exam Healthy female exam Vasectomy for contraception  2. Cervical cancer screening - Cytology - PAP( [place])  3. Encounter for screening mammogram  for malignant neoplasm of breast  4. Routine screening for STI (sexually transmitted infection) - Hepatitis B surface antigen - Hepatitis C antibody - HIV Antibody (routine testing w rflx) - RPR  5. Vaginal itching - Cervicovaginal ancillary only   Will follow up results of pap smear/STI screen and manage accordingly. Encouraged improvement in diet and exercise.  COVID vaccine UTD Accepts STI screen. Mammogram ordered Referral for colonoscopy n/a Flu vaccine declined today DEXA not due based on age  Routine preventative health maintenance measures emphasized. Please refer to After Visit Summary for other  counseling recommendations.   Total face-to-face time with patient: 20 minutes. Over 50% of encounter was spent on counseling and coordination of care.   Baldemar Lenis, M.D. Attending [facility])

## 2020-03-30 LAB — CERVICOVAGINAL ANCILLARY ONLY
Bacterial Vaginitis (gardnerella): NEGATIVE
Candida Glabrata: NEGATIVE
Candida Vaginitis: NEGATIVE
Chlamydia: NEGATIVE
Comment: NEGATIVE
Comment: NEGATIVE
Comment: NEGATIVE
Comment: NEGATIVE
Comment: NEGATIVE
Comment: NORMAL
Neisseria Gonorrhea: NEGATIVE
Trichomonas: NEGATIVE

## 2020-04-02 LAB — CYTOLOGY - PAP
Comment: NEGATIVE
Diagnosis: NEGATIVE
High risk HPV: NEGATIVE

## 2020-08-23 IMAGING — US US MFM OB DETAIL +14 WK
1 series · 13 of 28 positions shown · non-contrast
Comparison: none

[Series 1: us mfm ob detail +14 wk · 56 acquisitions, 13 frames shown]
[im 3/56]
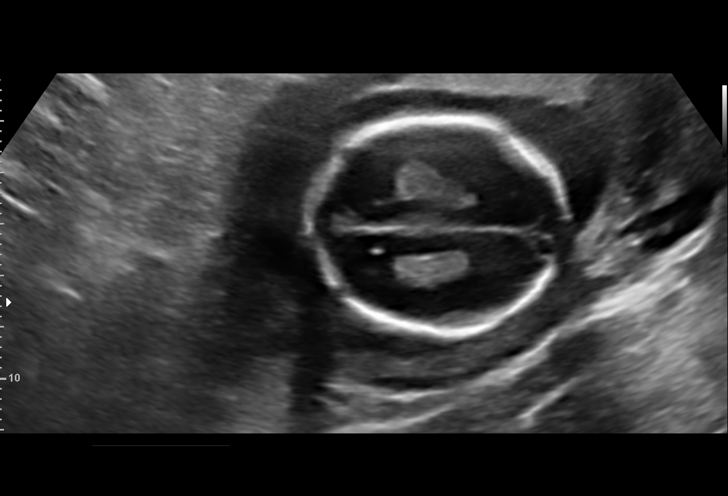
[im 7/56]
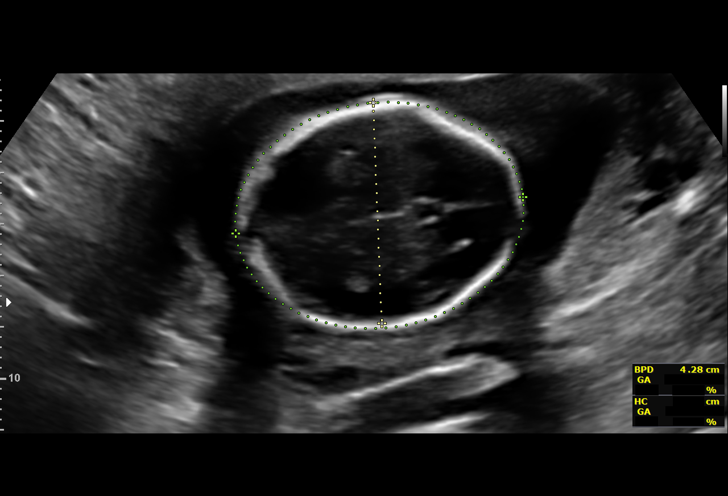
[im 11/56]
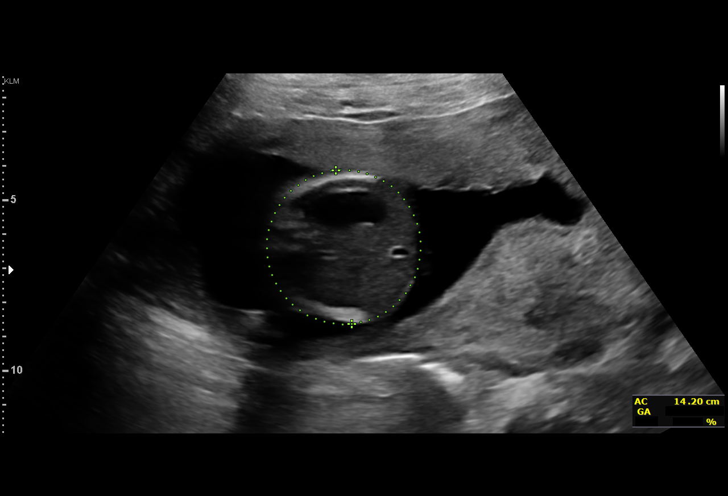
[im 15/56]
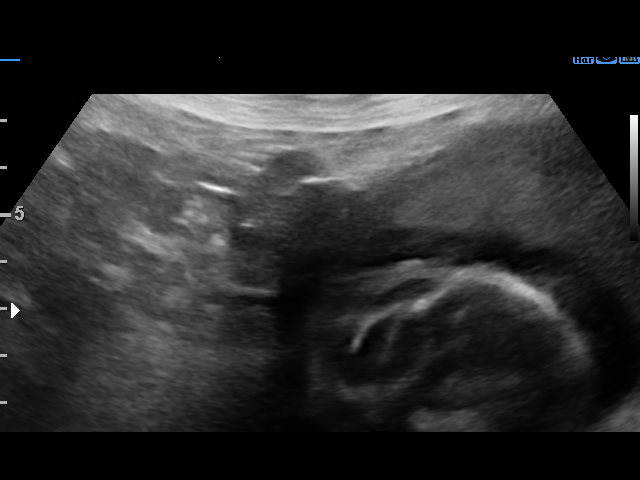
[im 19/56]
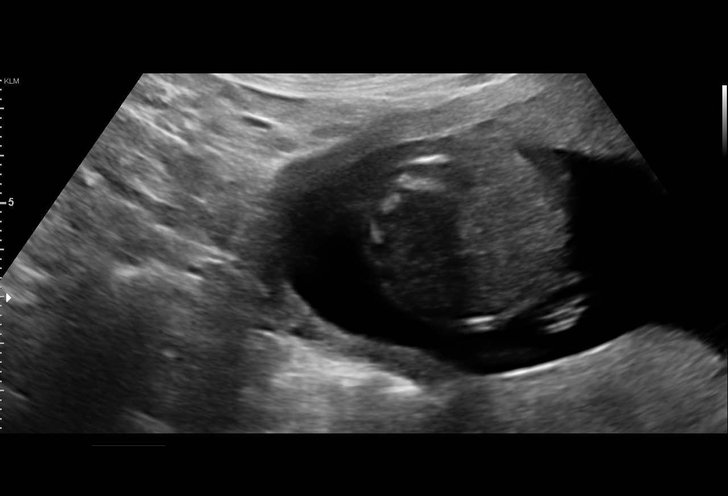
[im 23/56]
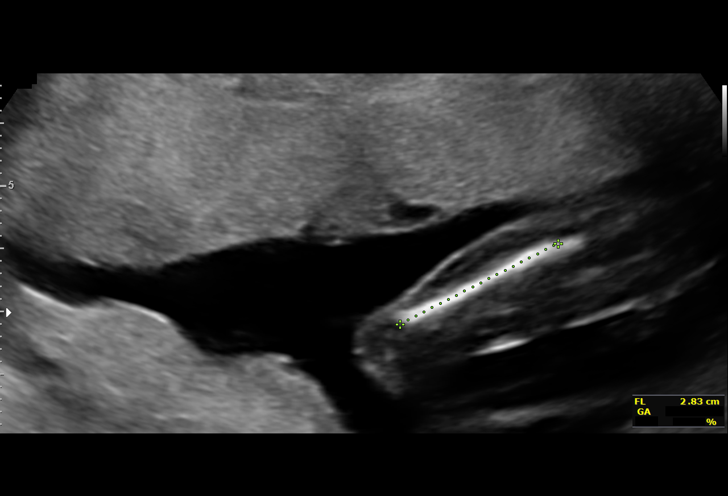
[im 29/56]
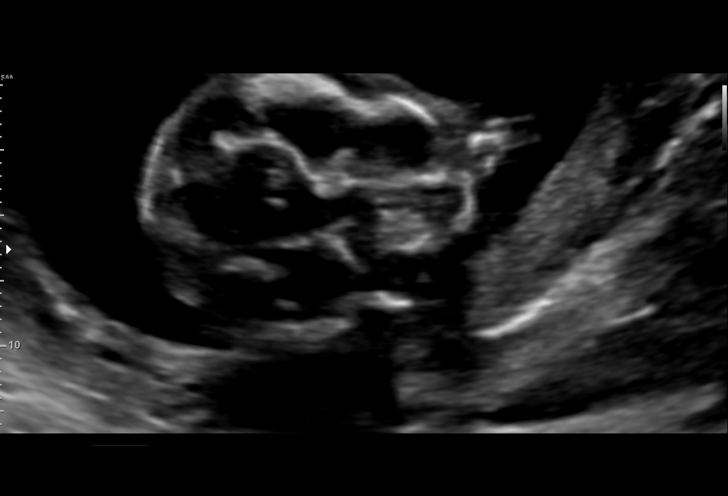
[im 33/56]
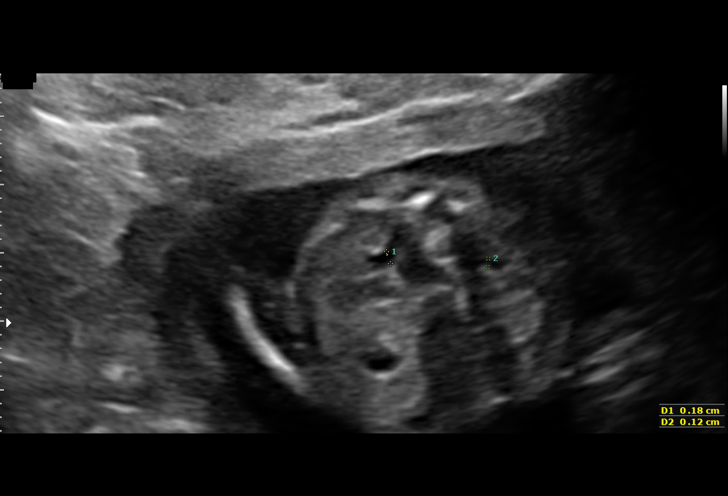
[im 37/56]
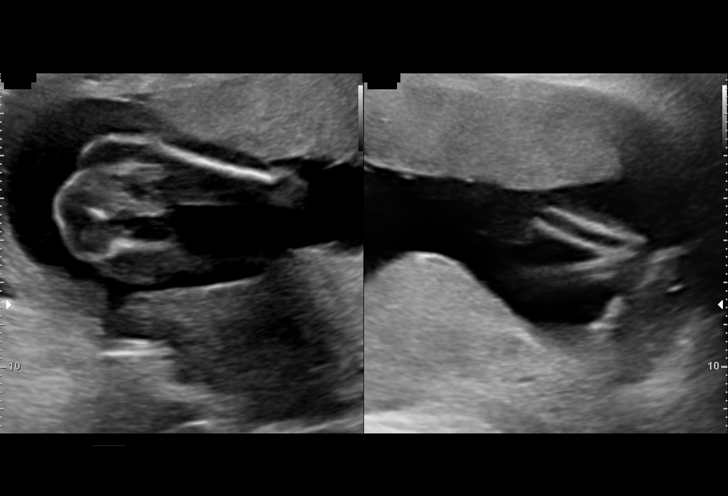
[im 41/56]
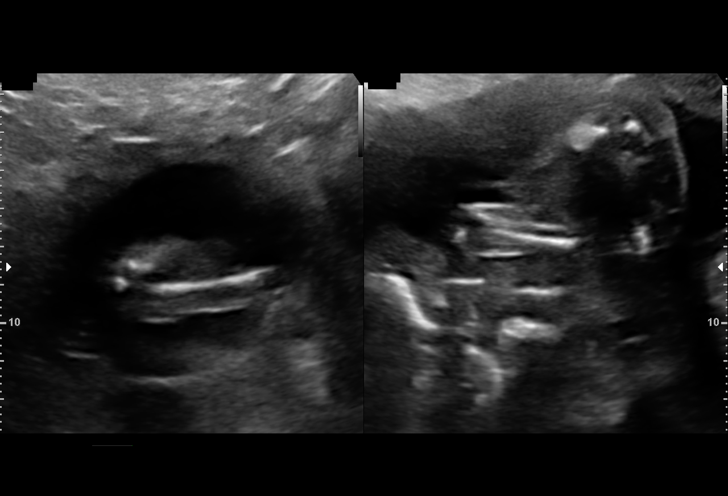
[im 45/56]
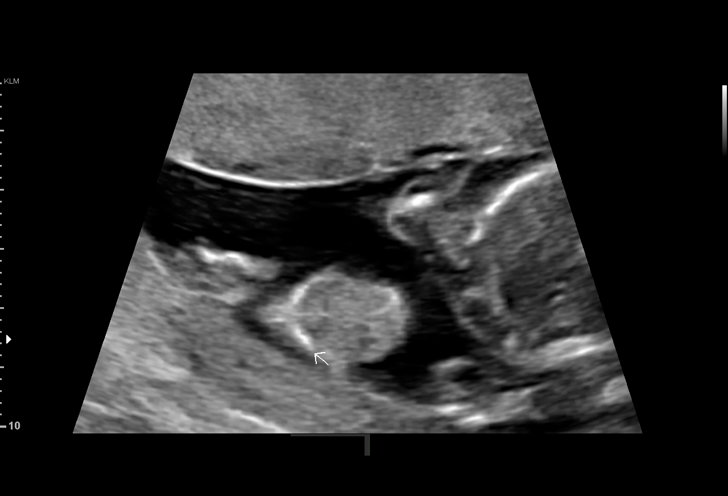
[im 49/56]
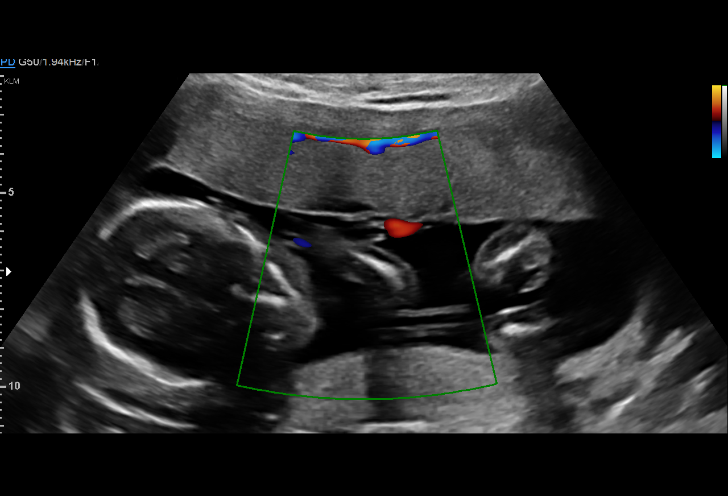
[im 53/56]
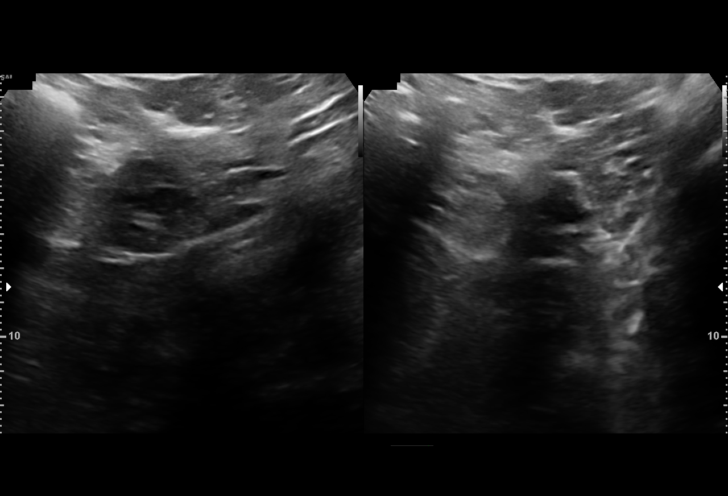

[13 of 28 positions shown; findings below may reference images not displayed]

----------------------------------------------------------------------

 ----------------------------------------------------------------------
Indications

  Encounter for antenatal screening for
  malformations
  19 weeks gestation of pregnancy
  Advanced maternal age multigravida 35+,
  second trimester
  Poor obstetric history: Previous
  preeclampsia / eclampsia/gestational HTN
  Poor obstetric history: Previous gestational
  diabetes
  Previous cesarean delivery, antepartum
  Poor obstetric history: Previous preterm
  delivery, antepartum
 ----------------------------------------------------------------------
Fetal Evaluation

 Num Of Fetuses:         1
 Fetal Heart Rate(bpm):  159
 Cardiac Activity:       Observed
 Presentation:           Transverse, head to maternal right
 Placenta:               Anterior
 P. Cord Insertion:      Visualized

 Amniotic Fluid
 AFI FV:      Within normal limits

                             Largest Pocket(cm)
                             4
Biometry

 BPD:        43  mm     G. Age:  19w 0d         51  %    CI:        75.19   %    70 - 86
                                                         FL/HC:      18.2   %    16.1 -
 HC:      157.3  mm     G. Age:  18w 5d         25  %    HC/AC:      1.11        1.09 -
 AC:       142   mm     G. Age:  19w 4d         64  %    FL/BPD:     66.7   %
 FL:       28.7  mm     G. Age:  18w 6d         37  %    FL/AC:      20.2   %    20 - 24
 HUM:      28.3  mm     G. Age:  19w 1d         53  %
 CER:      19.5  mm     G. Age:  18w 5d         43  %

 Est. FW:     276  gm    0 lb 10 oz      47  %
OB History

 Gravidity:    6         Term:   3        Prem:   0        SAB:   1
 TOP:          0       Ectopic:  0        Living: 4
Gestational Age

 LMP:           22w 2d        Date:  05/15/18                 EDD:   02/19/19
 U/S Today:     19w 0d                                        EDD:   03/14/19
 Best:          19w 0d     Det. By:  Early Ultrasound         EDD:   03/14/19
                                     (08/09/18)
Anatomy

 Cranium:               Appears normal         Aortic Arch:            Appears normal
 Cavum:                 Appears normal         Ductal Arch:            Appears normal
 Ventricles:            Appears normal         Diaphragm:              Appears normal
 Choroid Plexus:        Appears normal         Stomach:                Appears normal, left
                                                                       sided
 Cerebellum:            Appears normal         Abdomen:                Appears normal
 Posterior Fossa:       Appears normal         Abdominal Wall:         Appears nml (cord
                                                                       insert, abd wall)
 Nuchal Fold:           Appears normal         Cord Vessels:           Appears normal (3
                                                                       vessel cord)
 Face:                  Orbits nl; profile not Kidneys:                Appear normal
                        well visualized
 Lips:                  Appears normal         Bladder:                Appears normal
 Thoracic:              Appears normal         Spine:                  Appears normal
 Heart:                 Not well visualized    Upper Extremities:      Appears normal
 RVOT:                  Not well visualized    Lower Extremities:      Appears normal
 LVOT:                  Not well visualized

 Other:  Parents do not wish to know sex of fetus. Heels visualized. Hands not
         well visualized. Technically difficult due to maternal habitus.
Cervix Uterus Adnexa

 Cervix
 Length:            3.4  cm.
 Normal appearance by transabdominal scan.

 Uterus
 No abnormality visualized.

 Left Ovary
 Within normal limits.

 Right Ovary
 Within normal limits.

 Cul De Sac
 No free fluid seen.
 Adnexa
 No abnormality visualized.
Impression

 Normal interval growth.  No ultrasonic evidence of structural
 fetal anomalies.
 Suboptimal views of the fetal anatomy was obtained
 secondary to fetal position.
Recommendations

 Follow up anatomy in 5-7 weeks.

## 2021-05-07 ENCOUNTER — Ambulatory Visit (INDEPENDENT_AMBULATORY_CARE_PROVIDER_SITE_OTHER): Payer: BC Managed Care – PPO | Admitting: Advanced Practice Midwife

## 2021-05-07 ENCOUNTER — Encounter: Payer: Self-pay | Admitting: Advanced Practice Midwife

## 2021-05-07 ENCOUNTER — Other Ambulatory Visit (HOSPITAL_COMMUNITY)
Admission: RE | Admit: 2021-05-07 | Discharge: 2021-05-07 | Disposition: A | Discharge status: Home / Self Care | Payer: BC Managed Care – PPO | Source: Ambulatory Visit | Attending: Advanced Practice Midwife | Admitting: Advanced Practice Midwife

## 2021-05-07 ENCOUNTER — Other Ambulatory Visit: Payer: Self-pay

## 2021-05-07 VITALS — BP 155/93 | Ht 64.0 in | Wt 219.0 lb

## 2021-05-07 DIAGNOSIS — I10 Essential (primary) hypertension: Secondary | ICD-10-CM

## 2021-05-07 DIAGNOSIS — N898 Other specified noninflammatory disorders of vagina: Secondary | ICD-10-CM | POA: Diagnosis present

## 2021-05-07 DIAGNOSIS — N912 Amenorrhea, unspecified: Secondary | ICD-10-CM | POA: Diagnosis not present

## 2021-05-07 DIAGNOSIS — K649 Unspecified hemorrhoids: Secondary | ICD-10-CM

## 2021-05-07 DIAGNOSIS — R399 Unspecified symptoms and signs involving the genitourinary system: Secondary | ICD-10-CM

## 2021-05-07 DIAGNOSIS — Z3202 Encounter for pregnancy test, result negative: Secondary | ICD-10-CM

## 2021-05-07 LAB — POCT URINALYSIS DIPSTICK
Bilirubin, UA: NEGATIVE
Glucose, UA: NEGATIVE
Ketones, UA: NEGATIVE
Leukocytes, UA: NEGATIVE
Nitrite, UA: NEGATIVE
Protein, UA: NEGATIVE
Spec Grav, UA: 1.01 (ref 1.010–1.025)
Urobilinogen, UA: 0.2 E.U./dL
pH, UA: 5 (ref 5.0–8.0)

## 2021-05-07 LAB — POCT URINE PREGNANCY: Preg Test, Ur: NEGATIVE

## 2021-05-07 MED ORDER — PRAMOXINE HCL (PERIANAL) 1 % EX FOAM: 1.0000 "application " | Freq: Three times a day (TID) | CUTANEOUS | 0 refills | Status: DC | PRN

## 2021-05-07 MED ORDER — DOCUSATE SODIUM 100 MG PO CAPS: 100.0000 mg | ORAL_CAPSULE | Freq: Two times a day (BID) | ORAL | 2 refills | Status: AC | PRN

## 2021-05-07 NOTE — Progress Notes (Signed)
° °  GYNECOLOGY PROGRESS NOTE  History:  41 y.o. [id number] presents to [facility] office today for problem gyn visit. She reports vaginal itching/irritation, bumps in the vaginal area with occasional itching, hemorrhoids that are often painful, and   She denies h/a, dizziness, shortness of breath, n/v, or fever/chills.    The following portions of the patient's history were reviewed and updated as appropriate: allergies, current medications, past family history, past medical history, past social history, past surgical history and problem list. Last pap smear on 03/29/20 was normal, negative HRHPV.  Health Maintenance Due  Topic Date Due   COVID-19 Vaccine (1) Never done   Pneumococcal Vaccine 77-52 Years old (1 - PCV) Never done   Hepatitis C Screening  Never done   INFLUENZA VACCINE  Never done     Review of Systems:  Pertinent items are noted in HPI.   Objective:  Physical Exam Blood pressure (!) 155/93, height 5\' 4"  (1.626 m), weight 219 lb (99.3 kg), last menstrual period 02/28/2021. VS reviewed, nursing note reviewed,  Constitutional: well developed, well nourished, no distress HEENT: normocephalic CV: normal rate Pulm/chest wall: normal effort Breast Exam: deferred Abdomen: soft Neuro: alert and oriented x 3 Skin: warm, dry Psych: affect normal Pelvic exam: Cervix pink, visually closed, without lesion, scant white creamy discharge, vaginal walls and external genitalia normal Bimanual exam: Cervix 0/long/high, firm, anterior, neg CMT, uterus nontender, nonenlarged, adnexa without tenderness, enlargement, or mass  Assessment & Plan:  1. Amenorrhea --Pt with regular periods since menarche then had 2 periods in October and no period x 2+ months. --No significant weight gain or loss, no changes in stress level, no other change in symptoms --Some vaginal spotting in last few days, no menses --UPT negative  - POCT urine pregnancy - TSH - LH - FSH - Testosterone -  Prolactin - B-HCG Quant  2. Vaginal odor - Cervicovaginal ancillary only( [place])  3. Vaginal itching - Cervicovaginal ancillary only( )  4. UTI symptoms - POCT Urinalysis Dipstick - Urine Culture  5. Hemorrhoids, unspecified hemorrhoid type --On exam, 3-4 residual tissue from external hemorrhoids, no evidence of thrombosis with one smaller hemorrhoid, less than 0.5 cm x 0.5 cm in diameter with evidence of mild thrombosis and with tenderness to palpation on exam --Pt without current constipation or straining, hemorrhoids since last pregnancy. She is interested in long term solutions so they do not continue to recur  - Ambulatory referral to Gastroenterology - docusate sodium (COLACE) 100 MG capsule; Take 1 capsule (100 mg total) by mouth 2 (two) times daily as needed.  Dispense: 30 capsule; Refill: 2 - pramoxine (PROCTOFOAM) 1 % foam; Place 1 application rectally 3 (three) times daily as needed for anal itching.  Dispense: 15 g; Refill: 0  6. Benign essential hypertension --BP elevated today, no  h/a, vision changes, shortness of breath, or chest pain.   --F/U with PCP   7. Vaginal lesion --In addition to generalized vaginal irritation, pt feels vaginal bumps on labia majora, bilaterally, and these sometimes itch as well.   --On visual inspection, smooth, flesh colored raised irregular bumps on left and right labia.  May be pt anatomy, no evidence of infection or injury. --Pt to follow up with MD at next visit for further evaluation.  November, CNM 1:56 PM

## 2021-05-08 LAB — CERVICOVAGINAL ANCILLARY ONLY
Bacterial Vaginitis (gardnerella): NEGATIVE
Candida Glabrata: NEGATIVE
Candida Vaginitis: NEGATIVE
Comment: NEGATIVE
Comment: NEGATIVE
Comment: NEGATIVE

## 2021-05-09 LAB — URINE CULTURE
MICRO NUMBER:: 12854196
SPECIMEN QUALITY:: ADEQUATE

## 2021-05-09 MED ORDER — FLUCONAZOLE 150 MG PO TABS: 150.0000 mg | ORAL_TABLET | Freq: Once | ORAL | 0 refills | Status: AC

## 2021-05-09 NOTE — Addendum Note (Signed)
Addended by: Sharen Counter A on: 05/09/2021 10:09 AM   Modules accepted: Orders

## 2021-05-20 ENCOUNTER — Telehealth: Payer: Self-pay | Admitting: *Deleted

## 2021-05-20 DIAGNOSIS — K649 Unspecified hemorrhoids: Secondary | ICD-10-CM

## 2021-05-20 MED ORDER — FLUCONAZOLE 150 MG PO TABS: ORAL_TABLET | ORAL | 0 refills | Status: AC

## 2021-05-20 MED ORDER — PRAMOXINE HCL (PERIANAL) 1 % EX FOAM: 1.0000 "application " | Freq: Three times a day (TID) | CUTANEOUS | 0 refills | Status: AC | PRN

## 2021-05-20 NOTE — Telephone Encounter (Signed)
error 

## 2021-05-20 NOTE — Telephone Encounter (Signed)
Pt called unaware that she had RX sent to her pharmacy.  She has been unable to get into her my chart to view messages.  She is requesting that the Diflucan and proctofoam be sent to [facility] in [place] instead of [place].

## 2023-06-15 ENCOUNTER — Encounter: Payer: Self-pay | Admitting: Obstetrics and Gynecology

## 2023-06-15 ENCOUNTER — Ambulatory Visit (INDEPENDENT_AMBULATORY_CARE_PROVIDER_SITE_OTHER): Payer: BC Managed Care – PPO | Admitting: Obstetrics and Gynecology

## 2023-06-15 ENCOUNTER — Other Ambulatory Visit (HOSPITAL_COMMUNITY)
Admission: RE | Admit: 2023-06-15 | Discharge: 2023-06-15 | Disposition: A | Discharge status: Home / Self Care | Payer: BC Managed Care – PPO | Source: Ambulatory Visit | Attending: Obstetrics and Gynecology | Admitting: Obstetrics and Gynecology

## 2023-06-15 VITALS — BP 137/94 | HR 82 | Ht 64.0 in | Wt 238.0 lb

## 2023-06-15 DIAGNOSIS — N898 Other specified noninflammatory disorders of vagina: Secondary | ICD-10-CM

## 2023-06-15 DIAGNOSIS — Z124 Encounter for screening for malignant neoplasm of cervix: Secondary | ICD-10-CM | POA: Diagnosis not present

## 2023-06-15 DIAGNOSIS — R3129 Other microscopic hematuria: Secondary | ICD-10-CM | POA: Diagnosis not present

## 2023-06-15 DIAGNOSIS — L280 Lichen simplex chronicus: Secondary | ICD-10-CM

## 2023-06-15 MED ORDER — TRIAMCINOLONE ACETONIDE 0.1 % EX OINT: 1.0000 | TOPICAL_OINTMENT | Freq: Two times a day (BID) | CUTANEOUS | 0 refills | Status: AC

## 2023-06-15 NOTE — Progress Notes (Signed)
   GYNECOLOGY OFFICE VISIT NOTE  History:   Summer Padilla is a 43 y.o. [phone] here today for cramping after intercourse that is also associated with vaginal discharge and vaginal odor.  The symptoms have been present for about 1.5 weeks.   She notes for at least several weeks she has also had a vulvar itch that has caused there to be bumps on her vulvar.   She also notes incidentally a long history of microscopic hematuria but has not had a work up for it. She denies visible hematuria.   The following portions of the patient's history were reviewed and updated as appropriate: allergies, current medications, past family history, past medical history, past social history, past surgical history and problem list.   Health Maintenance:   Diagnosis  Date Value Ref Range Status  03/29/2020   Final   - Negative for intraepithelial lesion or malignancy (NILM)   Review of Systems:  Pertinent items noted in HPI and remainder of comprehensive ROS otherwise negative.  Physical Exam:  BP (!) 137/94   Pulse 82   Ht 5\' 4"  (1.626 m)   Wt 238 lb (108 kg)   LMP 05/29/2023   BMI 40.85 kg/m  CONSTITUTIONAL: Well-developed, well-nourished female in no acute distress.  HEENT:  Normocephalic, atraumatic. External right and left ear normal. No scleral icterus.  NECK: Normal range of motion, supple, no masses noted on observation SKIN: No rash noted. Not diaphoretic. No erythema. No pallor. MUSCULOSKELETAL: Normal range of motion. No edema noted. NEUROLOGIC: Alert and oriented to person, place, and time. Normal muscle tone coordination. No cranial nerve deficit noted. PSYCHIATRIC: Normal mood and affect. Normal behavior. Normal judgment and thought content.  PELVIC: Normal appearing external genitalia except lichen simplex noted on bilateral labia; normal urethral meatus; normal appearing vaginal mucosa and cervix.  No abnormal discharge noted.  Normal uterine size, no other palpable masses, no  uterine or adnexal tenderness. Performed in the presence of a chaperone  Labs and Imaging No results found for this or any previous visit (from the past week). No results found.  Assessment and Plan:  Jaeleah was seen today for vaginal discharge.  Diagnoses and all orders for this visit:  Vaginal discharge Await cultures to determine therapy.  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 Reviewed importance of taper otherwise may develop rebound itching.  -     triamcinolone ointment (KENALOG) 0.1 %; Apply 1 Application topically 2 (two) times daily. Apply twice a day for two weeks, then nightly for two weeks, then as needed  Microscopic hematuria -     Ambulatory referral to Urology   Meds ordered this encounter  Medications   triamcinolone ointment (KENALOG) 0.1 %    Sig: Apply 1 Application topically 2 (two) times daily. Apply twice a day for two weeks, then nightly for two weeks, then as needed    Dispense:  30 g    Refill:  0     Routine preventative health maintenance measures emphasized. Please refer to After Visit Summary for other counseling recommendations.   Return in about 1 year (around 06/14/2024) for annual.  Milas Hock, MD, FACOG Obstetrician & Gynecologist, [facility] for [employer], [facility]

## 2023-06-16 LAB — CERVICOVAGINAL ANCILLARY ONLY
Bacterial Vaginitis (gardnerella): NEGATIVE
Candida Glabrata: NEGATIVE
Candida Vaginitis: NEGATIVE
Chlamydia: NEGATIVE
Comment: NEGATIVE
Comment: NEGATIVE
Comment: NEGATIVE
Comment: NEGATIVE
Comment: NEGATIVE
Comment: NORMAL
Neisseria Gonorrhea: NEGATIVE
Trichomonas: NEGATIVE

## 2023-06-18 ENCOUNTER — Encounter: Payer: Self-pay | Admitting: Obstetrics and Gynecology

## 2023-06-18 LAB — URINE CULTURE

## 2023-06-18 LAB — CYTOLOGY - PAP
Comment: NEGATIVE
Diagnosis: NEGATIVE
High risk HPV: NEGATIVE

## 2023-06-23 ENCOUNTER — Ambulatory Visit: Payer: BC Managed Care – PPO | Admitting: Obstetrics and Gynecology

## 2023-06-29 NOTE — Progress Notes (Signed)
   Chief Complaint: No chief complaint on file.   History of Present Illness:  Summer Padilla is a 43 y.o. female who is seen in consultation from Patient, No Pcp Per for evaluation of ***.   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   W/U negative (CT/Cystoscopy)     Past Surgical History:  Past Surgical History:  Procedure Laterality Date   ABDOMINOPLASTY     CESAREAN SECTION     HAND SURGERY      Allergies:  No Known Allergies  Family History:  Family History  Problem Relation Age of Onset   Cancer Mother        cervical   Diabetes Maternal Aunt    Hearing loss Maternal Grandmother    Breast cancer Paternal Aunt    Breast cancer Paternal Aunt    Breast cancer Cousin     Social History:  Social History   Tobacco Use   Smoking status: Never   Smokeless tobacco: Never  Vaping Use   Vaping status: Never Used  Substance Use Topics   Alcohol use: No   Drug use: No    Review of symptoms:  Constitutional:  Negative for unexplained weight loss, night sweats, fever, chills ENT:  Negative for nose bleeds, sinus pain, painful swallowing CV:  Negative for chest pain, shortness of breath, exercise intolerance, palpitations, loss of consciousness Resp:  Negative for cough, wheezing, shortness of breath GI:  Negative for nausea, vomiting, diarrhea, bloody stools GU:  Positives noted in HPI; otherwise negative for gross hematuria, dysuria, urinary incontinence Neuro:  Negative for seizures, poor balance, limb weakness, slurred speech Psych:  Negative for lack of energy, depression, anxiety Endocrine:  Negative for polydipsia, polyuria, symptoms of hypoglycemia (dizziness, hunger,  sweating) Hematologic:  Negative for anemia, purpura, petechia, prolonged or excessive bleeding, use of anticoagulants  Allergic:  Negative for difficulty breathing or choking as a result of exposure to anything; no shellfish allergy; no allergic response (rash/itch) to materials, foods  Physical exam: LMP 05/29/2023  GENERAL APPEARANCE:  Well appearing, well developed, well nourished, NAD HEENT: Atraumatic, Normocephalic, oropharynx clear. NECK: Supple without lymphadenopathy or thyromegaly. LUNGS: Clear to auscultation bilaterally. HEART: Regular Rate and Rhythm without murmurs, gallops, or rubs. ABDOMEN: Soft, non-tender, No Masses. EXTREMITIES: Moves all extremities well.  Without clubbing, cyanosis, or edema. NEUROLOGIC:  Alert and oriented x 3, normal gait, CN II-XII grossly intact.  MENTAL STATUS:  Appropriate. BACK:  Non-tender to palpation.  No CVAT SKIN:  Warm, dry and intact.    Results: No results found for this or any previous visit (from the past 24 hours).  I have reviewed prior patient's records  I have reviewed referring/prior physicians records  I have reviewed urinalysis  I have reviewed prior urine cultures  I reviewed prior imaging studies  Assessment: No diagnosis found.   Plan: ***

## 2023-06-30 ENCOUNTER — Ambulatory Visit: Payer: Self-pay | Admitting: Urology

## 2023-06-30 VITALS — BP 118/79 | HR 91

## 2023-06-30 DIAGNOSIS — R3129 Other microscopic hematuria: Secondary | ICD-10-CM

## 2023-07-01 LAB — URINALYSIS, ROUTINE W REFLEX MICROSCOPIC
Bilirubin, UA: NEGATIVE
Glucose, UA: NEGATIVE
Ketones, UA: NEGATIVE
Leukocytes,UA: NEGATIVE
Nitrite, UA: NEGATIVE
Protein,UA: NEGATIVE
Specific Gravity, UA: 1.025 (ref 1.005–1.030)
Urobilinogen, Ur: 0.2 mg/dL (ref 0.2–1.0)
pH, UA: 6 (ref 5.0–7.5)

## 2023-07-01 LAB — MICROSCOPIC EXAMINATION: Bacteria, UA: NONE SEEN

## 2023-07-04 ENCOUNTER — Ambulatory Visit (HOSPITAL_COMMUNITY)
Admission: RE | Admit: 2023-07-04 | Discharge: 2023-07-04 | Disposition: A | Discharge status: Home / Self Care | Source: Ambulatory Visit | Attending: Urology | Admitting: Urology

## 2023-07-04 DIAGNOSIS — R3129 Other microscopic hematuria: Secondary | ICD-10-CM | POA: Insufficient documentation

## 2023-07-04 MED ORDER — IOHEXOL 300 MG/ML  SOLN
100.0000 mL | Freq: Once | INTRAMUSCULAR | Status: AC | PRN
  Administered 2023-07-04: 100 mL via INTRAVENOUS

## 2023-07-06 NOTE — Progress Notes (Signed)
 Chief Complaint: No chief complaint on file.   History of Present Illness:  Summer Padilla is a 43 y.o. female here for evaluation and management of microscopic hematuria.  She states that this has been present for quite some time, doctors have told her that she has this.  She has never had gross hematuria.  No significant lower urinary tract symptoms.  Within the epic chart, it states that she had an evaluation in March 2014 including CT and cystoscopy.  She does not remember this, however.  She is a non-smoker.  She is around a fair amount of secondhand smoke when she goes to casinos.  She states that her GYN told her that she had a tender bladder on recent bimanual exam. 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   W/U negative (CT/Cystoscopy)     Past Surgical History:  Past Surgical History:  Procedure Laterality Date   ABDOMINOPLASTY     CESAREAN SECTION     HAND SURGERY      Allergies:  No Known Allergies  Family History:  Family History  Problem Relation Age of Onset   Cancer Mother        cervical   Diabetes Maternal Aunt    Hearing loss Maternal Grandmother    Breast cancer Paternal Aunt    Breast cancer Paternal Aunt    Breast cancer Cousin     Social History:  Social History   Tobacco Use   Smoking status: Never   Smokeless tobacco: Never  Vaping Use   Vaping status: Never Used  Substance Use Topics   Alcohol use: No   Drug use: No    Review of symptoms:  Constitutional:  Negative for unexplained weight loss, night sweats, fever, chills ENT:  Negative for nose bleeds, sinus pain, painful swallowing CV:  Negative for chest pain, shortness of breath, exercise intolerance,  palpitations, loss of consciousness Resp:  Negative for cough, wheezing, shortness of breath GI:  Negative for nausea, vomiting, diarrhea, bloody stools GU:  Positives noted in HPI; otherwise negative for gross hematuria, dysuria, urinary incontinence Neuro:  Negative for seizures, poor balance, limb weakness, slurred speech Psych:  Negative for lack of energy, depression, anxiety Endocrine:  Negative for polydipsia, polyuria, symptoms of hypoglycemia (dizziness, hunger, sweating) Hematologic:  Negative for anemia, purpura, petechia, prolonged or excessive bleeding, use of anticoagulants  Allergic:  Negative for difficulty breathing or choking as a result of exposure to anything; no shellfish allergy; no allergic response (rash/itch) to materials, foods  Physical exam: LMP 05/29/2023  GENERAL APPEARANCE:  Well appearing, well developed, well nourished, NAD HEENT: Atraumatic, Normocephalic LUNGS: Normal inspiratory and expiratory excursion HEART: Regular Rate NEUROLOGIC:  Alert and oriented x 3, normal gait, CN II-XII grossly intact.  MENTAL STATUS:  Appropriate. BACK:  Non-tender to palpation.  No CVAT SKIN:  Warm, dry and intact.    Results: I reviewed CT images with the patient and her mother.  A tiny left renal stone is noted.  Bladder looks pretty normal, but I did not see contrast-enhancement of the urine in the bladder.  I have reviewed referring/prior physicians records  I have reviewed urinalysis--urine today clear but multiple  prior urinalyses have revealed microscopic hematuria  I have reviewed prior laboratories including CBC, CMP.  Normal renal function  Indication: Microscopic hematuria  After informed consent and discussion of the procedure and its risks, Zada Finders was positioned and prepped in the standard fashion.  Cystoscopy was performed with a flexible cystoscope.   Findings:  Urethra: Normal Ureteral orifices: Normal position and configuration  bilaterally Bladder: No urothelial abnormalities   Assessment: Urinary with normal evaluation so far   Plan: I will notify her by way of MyChart once final CT reading comes in  If that is normal, I will have her come back as needed

## 2023-07-07 ENCOUNTER — Ambulatory Visit: Admitting: Urology

## 2023-07-07 VITALS — BP 154/95 | HR 84

## 2023-07-07 DIAGNOSIS — R3129 Other microscopic hematuria: Secondary | ICD-10-CM

## 2023-07-07 LAB — URINALYSIS, ROUTINE W REFLEX MICROSCOPIC
Bilirubin, UA: NEGATIVE
Glucose, UA: NEGATIVE
Ketones, UA: NEGATIVE
Leukocytes,UA: NEGATIVE
Nitrite, UA: NEGATIVE
Specific Gravity, UA: 1.025 (ref 1.005–1.030)
Urobilinogen, Ur: 1 mg/dL (ref 0.2–1.0)
pH, UA: 6 (ref 5.0–7.5)

## 2023-07-07 LAB — MICROSCOPIC EXAMINATION: Bacteria, UA: NONE SEEN

## 2023-07-14 ENCOUNTER — Ambulatory Visit: Payer: BC Managed Care – PPO | Admitting: Urology

## 2023-07-28 ENCOUNTER — Encounter: Payer: Self-pay | Admitting: Urology

## 2023-08-04 ENCOUNTER — Telehealth: Payer: Self-pay

## 2023-08-04 NOTE — Telephone Encounter (Signed)
 Called to give CT results per MD Dahlstedt "CT scan showed nothing significant that would cause the microscopic blood in her well.  A tiny little kidney stone that does not need to be treated.  I will forward CT results to her PCP. "

## 2023-08-04 NOTE — Telephone Encounter (Signed)
 Patient called and made aware "Please call her and let her know CT scan showed nothing significant that would cause the microscopic blood in her well.  A tiny little kidney stone that does not need to be treated.  I will forward CT results to her PCP."  Voiced understanding.
